# Patient Record
Sex: Female | Born: 1950 | Race: White | Hispanic: No | Marital: Married | Smoking: Never smoker
Health system: Southern US, Community
[De-identification: ages and names within clinical notes are randomized; demographics above are authoritative.]

## PROBLEM LIST (undated history)

## (undated) DIAGNOSIS — K219 Gastro-esophageal reflux disease without esophagitis: Secondary | ICD-10-CM

## (undated) DIAGNOSIS — K5790 Diverticulosis of intestine, part unspecified, without perforation or abscess without bleeding: Secondary | ICD-10-CM

## (undated) DIAGNOSIS — K760 Fatty (change of) liver, not elsewhere classified: Secondary | ICD-10-CM

## (undated) DIAGNOSIS — T8859XA Other complications of anesthesia, initial encounter: Secondary | ICD-10-CM

## (undated) DIAGNOSIS — K2100 Gastro-esophageal reflux disease with esophagitis, without bleeding: Secondary | ICD-10-CM

## (undated) DIAGNOSIS — D649 Anemia, unspecified: Secondary | ICD-10-CM

## (undated) DIAGNOSIS — M4802 Spinal stenosis, cervical region: Secondary | ICD-10-CM

## (undated) DIAGNOSIS — J189 Pneumonia, unspecified organism: Secondary | ICD-10-CM

## (undated) DIAGNOSIS — M51369 Other intervertebral disc degeneration, lumbar region without mention of lumbar back pain or lower extremity pain: Secondary | ICD-10-CM

## (undated) DIAGNOSIS — E785 Hyperlipidemia, unspecified: Secondary | ICD-10-CM

## (undated) DIAGNOSIS — J45909 Unspecified asthma, uncomplicated: Secondary | ICD-10-CM

## (undated) DIAGNOSIS — M5481 Occipital neuralgia: Secondary | ICD-10-CM

## (undated) DIAGNOSIS — F419 Anxiety disorder, unspecified: Secondary | ICD-10-CM

## (undated) DIAGNOSIS — M5417 Radiculopathy, lumbosacral region: Secondary | ICD-10-CM

## (undated) DIAGNOSIS — M47812 Spondylosis without myelopathy or radiculopathy, cervical region: Secondary | ICD-10-CM

## (undated) HISTORY — PX: COLONOSCOPY: SHX174

---

## 1982-04-05 HISTORY — PX: TUBAL LIGATION: SHX77

## 2004-04-05 HISTORY — PX: LAPAROSCOPIC CHOLECYSTECTOMY: SUR755

## 2005-10-31 HISTORY — PX: ANTERIOR CERVICAL DECOMP/DISCECTOMY FUSION: SHX1161

## 2006-04-05 HISTORY — PX: POSTERIOR LAMINECTOMY / DECOMPRESSION CERVICAL SPINE: SUR739

## 2016-04-05 HISTORY — PX: DUPUYTREN CONTRACTURE RELEASE: SHX1478

## 2019-06-04 HISTORY — PX: POSTERIOR FUSION CERVICAL SPINE: SUR628

## 2020-01-17 HISTORY — PX: PERCUTANEOUS LIVER BIOPSY: SUR136

## 2020-12-04 DIAGNOSIS — N813 Complete uterovaginal prolapse: Secondary | ICD-10-CM

## 2020-12-04 HISTORY — DX: Complete uterovaginal prolapse: N81.3

## 2020-12-22 HISTORY — PX: VAGINAL HYSTERECTOMY: SHX2639

## 2021-08-18 ENCOUNTER — Other Ambulatory Visit: Payer: Self-pay | Admitting: Sports Medicine

## 2021-08-18 DIAGNOSIS — G8929 Other chronic pain: Secondary | ICD-10-CM

## 2021-08-18 DIAGNOSIS — M84359A Stress fracture, hip, unspecified, initial encounter for fracture: Secondary | ICD-10-CM

## 2021-08-19 ENCOUNTER — Ambulatory Visit: Payer: Medicare HMO

## 2021-08-20 ENCOUNTER — Ambulatory Visit
Admission: RE | Admit: 2021-08-20 | Discharge: 2021-08-20 | Disposition: A | Discharge status: Home / Self Care | Payer: Medicare HMO | Source: Ambulatory Visit | Attending: Sports Medicine | Admitting: Sports Medicine

## 2021-08-20 DIAGNOSIS — G8929 Other chronic pain: Secondary | ICD-10-CM | POA: Diagnosis present

## 2021-08-20 DIAGNOSIS — M25551 Pain in right hip: Secondary | ICD-10-CM | POA: Insufficient documentation

## 2021-08-20 DIAGNOSIS — M84359A Stress fracture, hip, unspecified, initial encounter for fracture: Secondary | ICD-10-CM | POA: Diagnosis present

## 2021-09-15 ENCOUNTER — Encounter (INDEPENDENT_AMBULATORY_CARE_PROVIDER_SITE_OTHER): Payer: Self-pay

## 2021-09-16 ENCOUNTER — Other Ambulatory Visit: Payer: Self-pay | Admitting: Orthopedic Surgery

## 2021-09-22 ENCOUNTER — Encounter
Admission: RE | Admit: 2021-09-22 | Discharge: 2021-09-22 | Disposition: A | Discharge status: Home / Self Care | Payer: Medicare HMO | Source: Ambulatory Visit | Attending: Orthopedic Surgery | Admitting: Orthopedic Surgery

## 2021-09-22 VITALS — BP 108/63 | HR 77 | Temp 97.8°F | Resp 14 | Ht 66.5 in | Wt 132.0 lb

## 2021-09-22 DIAGNOSIS — R8290 Unspecified abnormal findings in urine: Secondary | ICD-10-CM | POA: Diagnosis not present

## 2021-09-22 DIAGNOSIS — M87051 Idiopathic aseptic necrosis of right femur: Secondary | ICD-10-CM | POA: Diagnosis not present

## 2021-09-22 DIAGNOSIS — Z01818 Encounter for other preprocedural examination: Secondary | ICD-10-CM | POA: Insufficient documentation

## 2021-09-22 DIAGNOSIS — Z01812 Encounter for preprocedural laboratory examination: Secondary | ICD-10-CM

## 2021-09-22 HISTORY — DX: Gastro-esophageal reflux disease with esophagitis, without bleeding: K21.00

## 2021-09-22 HISTORY — DX: Unspecified asthma, uncomplicated: J45.909

## 2021-09-22 HISTORY — DX: Hypomagnesemia: E83.42

## 2021-09-22 HISTORY — DX: Hyperlipidemia, unspecified: E78.5

## 2021-09-22 HISTORY — DX: Other complications of anesthesia, initial encounter: T88.59XA

## 2021-09-22 HISTORY — DX: Anxiety disorder, unspecified: F41.9

## 2021-09-22 HISTORY — DX: Pneumonia, unspecified organism: J18.9

## 2021-09-22 HISTORY — DX: Diverticulosis of intestine, part unspecified, without perforation or abscess without bleeding: K57.90

## 2021-09-22 HISTORY — DX: Fatty (change of) liver, not elsewhere classified: K76.0

## 2021-09-22 HISTORY — DX: Anemia, unspecified: D64.9

## 2021-09-22 HISTORY — DX: Spondylosis without myelopathy or radiculopathy, cervical region: M47.812

## 2021-09-22 HISTORY — DX: Spinal stenosis, cervical region: M48.02

## 2021-09-22 HISTORY — DX: Occipital neuralgia: M54.81

## 2021-09-22 LAB — TYPE AND SCREEN
ABO/RH(D): A POS
Antibody Screen: NEGATIVE

## 2021-09-22 LAB — CBC WITH DIFFERENTIAL/PLATELET
Abs Immature Granulocytes: 0.03 10*3/uL (ref 0.00–0.07)
Basophils Absolute: 0 10*3/uL (ref 0.0–0.1)
Basophils Relative: 1 %
Eosinophils Absolute: 0.1 10*3/uL (ref 0.0–0.5)
Eosinophils Relative: 1 %
HCT: 40.6 % (ref 36.0–46.0)
Hemoglobin: 13 g/dL (ref 12.0–15.0)
Immature Granulocytes: 1 %
Lymphocytes Relative: 22 %
Lymphs Abs: 1.4 10*3/uL (ref 0.7–4.0)
MCH: 33.1 pg (ref 26.0–34.0)
MCHC: 32 g/dL (ref 30.0–36.0)
MCV: 103.3 fL — ABNORMAL HIGH (ref 80.0–100.0)
Monocytes Absolute: 1.4 10*3/uL — ABNORMAL HIGH (ref 0.1–1.0)
Monocytes Relative: 22 %
Neutro Abs: 3.6 10*3/uL (ref 1.7–7.7)
Neutrophils Relative %: 53 %
Platelets: 175 10*3/uL (ref 150–400)
RBC: 3.93 MIL/uL (ref 3.87–5.11)
RDW: 12.8 % (ref 11.5–15.5)
WBC: 6.5 10*3/uL (ref 4.0–10.5)
nRBC: 0 % (ref 0.0–0.2)

## 2021-09-22 LAB — COMPREHENSIVE METABOLIC PANEL
ALT: 65 U/L — ABNORMAL HIGH (ref 0–44)
AST: 45 U/L — ABNORMAL HIGH (ref 15–41)
Albumin: 3.9 g/dL (ref 3.5–5.0)
Alkaline Phosphatase: 64 U/L (ref 38–126)
Anion gap: 6 (ref 5–15)
BUN: 9 mg/dL (ref 8–23)
CO2: 28 mmol/L (ref 22–32)
Calcium: 10.3 mg/dL (ref 8.9–10.3)
Chloride: 107 mmol/L (ref 98–111)
Creatinine, Ser: 0.51 mg/dL (ref 0.44–1.00)
GFR, Estimated: >60 mL/min (ref >60)
Glucose, Bld: 107 mg/dL — ABNORMAL HIGH (ref 70–99)
Potassium: 3.4 mmol/L — ABNORMAL LOW (ref 3.5–5.1)
Sodium: 141 mmol/L (ref 135–145)
Total Bilirubin: 0.8 mg/dL (ref 0.3–1.2)
Total Protein: 7.1 g/dL (ref 6.5–8.1)

## 2021-09-22 LAB — SURGICAL PCR SCREEN
MRSA, PCR: NEGATIVE
Staphylococcus aureus: NEGATIVE

## 2021-09-22 LAB — URINALYSIS, ROUTINE W REFLEX MICROSCOPIC
Bilirubin Urine: NEGATIVE
Glucose, UA: NEGATIVE mg/dL
Ketones, ur: NEGATIVE mg/dL
Nitrite: POSITIVE — AB
Protein, ur: NEGATIVE mg/dL
Specific Gravity, Urine: 1.012 (ref 1.005–1.030)
WBC, UA: >50 WBC/hpf — ABNORMAL HIGH (ref 0–5)
pH: 5 (ref 5.0–8.0)

## 2021-09-22 NOTE — Patient Instructions (Addendum)
Your procedure is scheduled on: Tuesday, June 27 Report to the Registration Desk on the 1st floor of the [facility] To find out your arrival time, please call [phone] between 1PM - 3PM on: Monday, June 26 If your arrival time is 6:00 am, do not arrive prior to that time as the [facility] entrance doors do not open until 6:00 am.  REMEMBER: Instructions that are not followed completely may result in serious medical risk, up to and including death; or upon the discretion of your surgeon and anesthesiologist your surgery may need to be rescheduled.  Do not eat food after midnight the night before surgery.  No gum chewing, lozengers or hard candies.  You may however, drink CLEAR liquids up to 2 hours before you are scheduled to arrive for your surgery. Do not drink anything within 2 hours of your scheduled arrival time.  Clear liquids include: - water  - apple juice without pulp - gatorade (not RED colors) - black coffee or tea (Do NOT add milk or creamers to the coffee or tea) Do NOT drink anything that is not on this list.  In addition, your doctor has ordered for you to drink the provided  Ensure Pre-Surgery Clear Carbohydrate Drink  Drinking this carbohydrate drink up to two hours before surgery helps to reduce insulin resistance and improve patient outcomes. Please complete drinking 2 hours prior to scheduled arrival time.  TAKE THESE MEDICATIONS THE MORNING OF SURGERY WITH A SIP OF WATER:  Albuterol nebulizer Breo ellipta inhaler Pantoprazole (protonix) - (take one the night before and one on the morning of surgery - helps to prevent nausea after surgery.) Tramadol as needed for pain  Use inhalers on the day of surgery and bring your albuterol inhaler to the hospital.  One week prior to surgery: starting today, June 20 Stop Anti-inflammatories (NSAIDS) such as Advil, Aleve, Ibuprofen, Motrin, Naproxen, Naprosyn and Aspirin based products such as Excedrin, Goodys  Powder, BC Powder. Stop ANY OVER THE COUNTER supplements until after surgery. You may however, continue to take Tylenol if needed for pain up until the day of surgery.  No Alcohol for 24 hours before or after surgery.  On the morning of surgery brush your teeth with toothpaste and water, you may rinse your mouth with mouthwash if you wish. Do not swallow any toothpaste or mouthwash.  Use CHG Soap as directed on instruction sheet.  Do not wear jewelry, make-up, hairpins, clips or nail polish.  Do not wear lotions, powders, or perfumes.   Do not shave body from the neck down 48 hours prior to surgery just in case you cut yourself which could leave a site for infection.  Also, freshly shaved skin may become irritated if using the CHG soap.  Contact lenses, hearing aids and dentures may not be worn into surgery.  Do not bring valuables to the hospital. [facility] is not responsible for any missing/lost belongings or valuables.   Notify your doctor if there is any change in your medical condition (cold, fever, infection).  Wear comfortable clothing (specific to your surgery type) to the hospital.  After surgery, you can help prevent lung complications by doing breathing exercises.  Take deep breaths and cough every 1-2 hours. Your doctor may order a device called an Incentive Spirometer to help you take deep breaths.  If you are being admitted to the hospital overnight, leave your suitcase in the car. After surgery it may be brought to your room.  If you  are being discharged the day of surgery, you will not be allowed to drive home. You will need a responsible adult (18 years or older) to drive you home and stay with you that night.   If you are taking public transportation, you will need to have a responsible adult (18 years or older) with you. Please confirm with your physician that it is acceptable to use public transportation.   Please call the Pre-admissions Testing Dept. at  [phone] if you have any questions about these instructions.  Surgery Visitation Policy:  Patients undergoing a surgery or procedure may have two family members or support persons with them as long as the person is not COVID-19 positive or experiencing its symptoms.   Inpatient Visitation:    Visiting hours are 7 a.m. to 8 p.m. Up to four visitors are allowed at one time in a patient room, including children. The visitors may rotate out with other people during the day. One designated support person (adult) may remain overnight.

## 2021-09-24 LAB — URINE CULTURE: Culture: 70000 — AB

## 2021-09-24 NOTE — Progress Notes (Signed)
  [facility] Perioperative Services: Pre-Admission/Anesthesia Testing  Abnormal Lab Notification and Treatment Plan of Care   Date: 09/24/21  Name: Cindy Barber MRN:   [id number]  Re: Abnormal labs noted during PAT appointment   Notified:  Provider Name Provider Role Notification Mode  Kennedy Bucker, MD Orthopedics (Surgeon) Routed and/or faxed via [facility]   Abnormal Lab Value(s):   Lab Results  Component Value Date   COLORURINE YELLOW (A) 09/22/2021   APPEARANCEUR HAZY (A) 09/22/2021   LABSPEC 1.012 09/22/2021   PHURINE 5.0 09/22/2021   GLUCOSEU NEGATIVE 09/22/2021   HGBUR SMALL (A) 09/22/2021   BILIRUBINUR NEGATIVE 09/22/2021   KETONESUR NEGATIVE 09/22/2021   PROTEINUR NEGATIVE 09/22/2021   NITRITE POSITIVE (A) 09/22/2021   LEUKOCYTESUR LARGE (A) 09/22/2021   EPIU 11-20 09/22/2021   WBCU >50 (H) 09/22/2021   RBCU 0-5 09/22/2021   BACTERIA MANY (A) 09/22/2021   CULT 70,000 COLONIES/mL KLEBSIELLA PNEUMONIAE (A) 09/22/2021    Clinical Information and Notes:  Patient is scheduled for an elective RIGHT TOTAL HIP ARTHROPLASTY on 09/29/2021  UA performed in PAT consistent with/concerning for infection.  No leukocytosis noted on CBC; WBC 6500 Renal function: Estimated Creatinine Clearance: 62.8 mL/min (by C-G formula based on SCr of 0.51 mg/dL). Urine C&S added to assess for pathogenically significant growth.  Impression and Plan:  Cindy Barber with a UA that was (+) for infection. Reflex culture sent that grew out Klebsiella pneumoniae.  Patient was contacted to discuss whether or not she was experiencing symptoms to determine need for preoperative treatment.  Patient advising that surgeon Rosita Kea, MD) has already called her in a prescription for antibiotics to take prior to her surgery.  Of note, final urine culture, including susceptibilities): Received this morning (09/24/2021).  Check care everywhere to ensure that prescribed antibiotic is susceptible to  identify pathogen.  MD prescribed Macrobid 100 mg twice daily x 5 days.  Identified urinary pathogen is indeed sensitive to this medication.  No further action is required at this time.   Quentin Mulling, MSN, APRN, FNP-C, CEN [facility]  Peri-operative Services Nurse Practitioner Phone: [phone] Fax: [phone] 09/24/21 9:14 AM  NOTE: This note has been prepared using Dragon dictation software. Despite my best ability to proofread, there is always the potential that unintentional transcriptional errors may still occur from this process.

## 2021-09-29 ENCOUNTER — Observation Stay: Payer: Medicare HMO

## 2021-09-29 ENCOUNTER — Ambulatory Visit: Payer: Medicare HMO | Admitting: Anesthesiology

## 2021-09-29 ENCOUNTER — Other Ambulatory Visit: Payer: Self-pay

## 2021-09-29 ENCOUNTER — Encounter
Admission: RE | Disposition: A | Discharge status: Home Health | Payer: Self-pay | Source: Ambulatory Visit | Attending: Orthopedic Surgery

## 2021-09-29 ENCOUNTER — Ambulatory Visit: Payer: Medicare HMO

## 2021-09-29 ENCOUNTER — Observation Stay
Admission: RE | Admit: 2021-09-29 | Discharge: 2021-09-30 | Disposition: A | Discharge status: Home Health | Payer: Medicare HMO | Source: Ambulatory Visit | Attending: Orthopedic Surgery | Admitting: Orthopedic Surgery

## 2021-09-29 ENCOUNTER — Encounter: Payer: Self-pay | Admitting: Orthopedic Surgery

## 2021-09-29 ENCOUNTER — Ambulatory Visit: Payer: Medicare HMO | Admitting: Urgent Care

## 2021-09-29 DIAGNOSIS — Z79899 Other long term (current) drug therapy: Secondary | ICD-10-CM | POA: Insufficient documentation

## 2021-09-29 DIAGNOSIS — J45909 Unspecified asthma, uncomplicated: Secondary | ICD-10-CM | POA: Diagnosis not present

## 2021-09-29 DIAGNOSIS — M1611 Unilateral primary osteoarthritis, right hip: Principal | ICD-10-CM | POA: Insufficient documentation

## 2021-09-29 DIAGNOSIS — M87051 Idiopathic aseptic necrosis of right femur: Secondary | ICD-10-CM | POA: Insufficient documentation

## 2021-09-29 DIAGNOSIS — Z01812 Encounter for preprocedural laboratory examination: Secondary | ICD-10-CM

## 2021-09-29 DIAGNOSIS — Z96641 Presence of right artificial hip joint: Secondary | ICD-10-CM

## 2021-09-29 HISTORY — PX: TOTAL HIP ARTHROPLASTY: SHX124

## 2021-09-29 LAB — CBC
HCT: 36.7 % (ref 36.0–46.0)
Hemoglobin: 11.7 g/dL — ABNORMAL LOW (ref 12.0–15.0)
MCH: 33 pg (ref 26.0–34.0)
MCHC: 31.9 g/dL (ref 30.0–36.0)
MCV: 103.4 fL — ABNORMAL HIGH (ref 80.0–100.0)
Platelets: 191 10*3/uL (ref 150–400)
RBC: 3.55 MIL/uL — ABNORMAL LOW (ref 3.87–5.11)
RDW: 12.2 % (ref 11.5–15.5)
WBC: 15.5 10*3/uL — ABNORMAL HIGH (ref 4.0–10.5)
nRBC: 0 % (ref 0.0–0.2)

## 2021-09-29 LAB — CREATININE, SERUM
Creatinine, Ser: 0.53 mg/dL (ref 0.44–1.00)
GFR, Estimated: >60 mL/min (ref >60)

## 2021-09-29 LAB — ABO/RH: ABO/RH(D): A POS

## 2021-09-29 SURGERY — ARTHROPLASTY, HIP, TOTAL, ANTERIOR APPROACH
Anesthesia: Spinal | Site: Hip | Laterality: Right

## 2021-09-29 MED ORDER — METHOCARBAMOL 500 MG PO TABS
500.0000 mg | ORAL_TABLET | Freq: Four times a day (QID) | ORAL | Status: DC | PRN
  Administered 2021-09-30: 500 mg via ORAL
  Filled 2021-09-29: qty 1

## 2021-09-29 MED ORDER — LACTATED RINGERS IV SOLN: INTRAVENOUS | Status: DC

## 2021-09-29 MED ORDER — PROPOFOL 1000 MG/100ML IV EMUL
INTRAVENOUS | Status: AC
  Filled 2021-09-29: qty 100

## 2021-09-29 MED ORDER — FENTANYL CITRATE (PF) 100 MCG/2ML IJ SOLN
INTRAMUSCULAR | Status: DC | PRN
  Administered 2021-09-29: 25 ug via INTRAVENOUS
  Administered 2021-09-29: 50 ug via INTRAVENOUS
  Administered 2021-09-29: 25 ug via INTRAVENOUS

## 2021-09-29 MED ORDER — TRAMADOL HCL 50 MG PO TABS
50.0000 mg | ORAL_TABLET | Freq: Four times a day (QID) | ORAL | Status: DC
  Administered 2021-09-29 – 2021-09-30 (×4): 50 mg via ORAL
  Filled 2021-09-29 (×4): qty 1

## 2021-09-29 MED ORDER — ONDANSETRON HCL 4 MG PO TABS: 4.0000 mg | ORAL_TABLET | Freq: Four times a day (QID) | ORAL | Status: DC | PRN

## 2021-09-29 MED ORDER — ADULT MULTIVITAMIN W/MINERALS CH
1.0000 | ORAL_TABLET | Freq: Every day | ORAL | Status: DC
  Administered 2021-09-30: 1 via ORAL
  Filled 2021-09-29: qty 1

## 2021-09-29 MED ORDER — OXYCODONE HCL 5 MG PO TABS
5.0000 mg | ORAL_TABLET | ORAL | Status: DC | PRN
  Administered 2021-09-29: 5 mg via ORAL
  Filled 2021-09-29: qty 2
  Filled 2021-09-29: qty 1

## 2021-09-29 MED ORDER — SODIUM CHLORIDE (PF) 0.9 % IJ SOLN
INTRAMUSCULAR | Status: DC | PRN
  Administered 2021-09-29: 90 mL

## 2021-09-29 MED ORDER — MIDAZOLAM HCL 5 MG/5ML IJ SOLN
INTRAMUSCULAR | Status: DC | PRN
  Administered 2021-09-29: 2 mg via INTRAVENOUS

## 2021-09-29 MED ORDER — ENOXAPARIN SODIUM 40 MG/0.4ML IJ SOSY
40.0000 mg | PREFILLED_SYRINGE | INTRAMUSCULAR | Status: DC
  Administered 2021-09-30: 40 mg via SUBCUTANEOUS
  Filled 2021-09-29: qty 0.4

## 2021-09-29 MED ORDER — FOLIC ACID 1 MG PO TABS
500.0000 ug | ORAL_TABLET | Freq: Every day | ORAL | Status: DC
Start: 2021-09-30 — End: 2021-09-30
  Administered 2021-09-30: 0.5 mg via ORAL
  Filled 2021-09-29: qty 1

## 2021-09-29 MED ORDER — CEFAZOLIN SODIUM-DEXTROSE 2-4 GM/100ML-% IV SOLN
INTRAVENOUS | Status: AC
  Filled 2021-09-29: qty 100

## 2021-09-29 MED ORDER — OXYCODONE HCL 5 MG PO TABS
ORAL_TABLET | ORAL | Status: AC
  Filled 2021-09-29: qty 1

## 2021-09-29 MED ORDER — CEFAZOLIN SODIUM-DEXTROSE 2-4 GM/100ML-% IV SOLN
2.0000 g | INTRAVENOUS | Status: AC
  Administered 2021-09-29: 2 g via INTRAVENOUS

## 2021-09-29 MED ORDER — SUCCINYLCHOLINE CHLORIDE 200 MG/10ML IV SOSY
PREFILLED_SYRINGE | INTRAVENOUS | Status: DC | PRN
  Administered 2021-09-29: 80 mg via INTRAVENOUS

## 2021-09-29 MED ORDER — CHLORHEXIDINE GLUCONATE 0.12 % MT SOLN: 15.0000 mL | Freq: Once | OROMUCOSAL | Status: AC

## 2021-09-29 MED ORDER — ALBUTEROL SULFATE (2.5 MG/3ML) 0.083% IN NEBU
2.5000 mg | INHALATION_SOLUTION | Freq: Four times a day (QID) | RESPIRATORY_TRACT | Status: DC | PRN
Start: 2021-09-29 — End: 2021-09-30

## 2021-09-29 MED ORDER — ALBUTEROL SULFATE HFA 108 (90 BASE) MCG/ACT IN AERS: 2.0000 | INHALATION_SPRAY | Freq: Four times a day (QID) | RESPIRATORY_TRACT | Status: DC | PRN

## 2021-09-29 MED ORDER — DOCUSATE SODIUM 100 MG PO CAPS
100.0000 mg | ORAL_CAPSULE | Freq: Two times a day (BID) | ORAL | Status: DC
  Administered 2021-09-29 – 2021-09-30 (×2): 100 mg via ORAL
  Filled 2021-09-29 (×2): qty 1

## 2021-09-29 MED ORDER — DIPHENHYDRAMINE HCL 12.5 MG/5ML PO ELIX: 12.5000 mg | ORAL_SOLUTION | ORAL | Status: DC | PRN

## 2021-09-29 MED ORDER — METOCLOPRAMIDE HCL 5 MG PO TABS: 5.0000 mg | ORAL_TABLET | Freq: Three times a day (TID) | ORAL | Status: DC | PRN

## 2021-09-29 MED ORDER — LIDOCAINE HCL (PF) 1 % IJ SOLN
INTRAMUSCULAR | Status: DC | PRN
  Administered 2021-09-29: 30 mL

## 2021-09-29 MED ORDER — BISACODYL 5 MG PO TBEC: 5.0000 mg | DELAYED_RELEASE_TABLET | Freq: Every day | ORAL | Status: DC | PRN

## 2021-09-29 MED ORDER — FENTANYL CITRATE (PF) 100 MCG/2ML IJ SOLN
INTRAMUSCULAR | Status: AC
  Administered 2021-09-29: 25 ug via INTRAVENOUS
  Filled 2021-09-29: qty 2

## 2021-09-29 MED ORDER — FENTANYL CITRATE (PF) 100 MCG/2ML IJ SOLN
INTRAMUSCULAR | Status: AC
  Filled 2021-09-29: qty 2

## 2021-09-29 MED ORDER — LIDOCAINE HCL (PF) 1 % IJ SOLN
INTRAMUSCULAR | Status: AC
  Filled 2021-09-29: qty 30

## 2021-09-29 MED ORDER — MAGNESIUM CHLORIDE 64 MG PO TBEC
2.0000 | DELAYED_RELEASE_TABLET | Freq: Two times a day (BID) | ORAL | Status: DC
  Administered 2021-09-29 – 2021-09-30 (×2): 128 mg via ORAL
  Filled 2021-09-29 (×2): qty 2

## 2021-09-29 MED ORDER — METOCLOPRAMIDE HCL 5 MG/ML IJ SOLN: 5.0000 mg | Freq: Three times a day (TID) | INTRAMUSCULAR | Status: DC | PRN

## 2021-09-29 MED ORDER — ZOLPIDEM TARTRATE 5 MG PO TABS: 5.0000 mg | ORAL_TABLET | Freq: Every evening | ORAL | Status: DC | PRN

## 2021-09-29 MED ORDER — BUPIVACAINE HCL (PF) 0.5 % IJ SOLN
INTRAMUSCULAR | Status: DC | PRN
  Administered 2021-09-29: 3 mL

## 2021-09-29 MED ORDER — PROPOFOL 500 MG/50ML IV EMUL
INTRAVENOUS | Status: DC | PRN
  Administered 2021-09-29: 40 ug/kg/min via INTRAVENOUS

## 2021-09-29 MED ORDER — ACETAMINOPHEN 500 MG PO TABS
1000.0000 mg | ORAL_TABLET | Freq: Once | ORAL | Status: AC
Start: 2021-09-29 — End: 2021-09-29
  Administered 2021-09-29: 1000 mg via ORAL

## 2021-09-29 MED ORDER — OXYCODONE HCL 5 MG PO TABS
5.0000 mg | ORAL_TABLET | Freq: Once | ORAL | Status: AC
  Administered 2021-09-29: 5 mg via ORAL

## 2021-09-29 MED ORDER — ONDANSETRON HCL 4 MG/2ML IJ SOLN
INTRAMUSCULAR | Status: DC | PRN
  Administered 2021-09-29: 4 mg via INTRAVENOUS

## 2021-09-29 MED ORDER — SODIUM CHLORIDE 0.9 % IV SOLN
INTRAVENOUS | Status: DC
Start: 2021-09-29 — End: 2021-09-30

## 2021-09-29 MED ORDER — ACETAMINOPHEN 325 MG PO TABS: 325.0000 mg | ORAL_TABLET | Freq: Four times a day (QID) | ORAL | Status: DC | PRN

## 2021-09-29 MED ORDER — METHOCARBAMOL 1000 MG/10ML IJ SOLN
500.0000 mg | Freq: Four times a day (QID) | INTRAVENOUS | Status: DC | PRN
  Filled 2021-09-29: qty 5

## 2021-09-29 MED ORDER — PHENYLEPHRINE HCL-NACL 20-0.9 MG/250ML-% IV SOLN
INTRAVENOUS | Status: DC | PRN
  Administered 2021-09-29: 20 ug/min via INTRAVENOUS

## 2021-09-29 MED ORDER — LIDOCAINE HCL (PF) 2 % IJ SOLN
INTRAMUSCULAR | Status: AC
  Filled 2021-09-29: qty 5

## 2021-09-29 MED ORDER — FLEET ENEMA 7-19 GM/118ML RE ENEM: 1.0000 | ENEMA | Freq: Once | RECTAL | Status: DC | PRN

## 2021-09-29 MED ORDER — SENNOSIDES-DOCUSATE SODIUM 8.6-50 MG PO TABS
1.0000 | ORAL_TABLET | Freq: Every evening | ORAL | Status: DC | PRN
Start: 2021-09-29 — End: 2021-09-30
  Administered 2021-09-30: 1 via ORAL
  Filled 2021-09-29: qty 1

## 2021-09-29 MED ORDER — EPHEDRINE SULFATE (PRESSORS) 50 MG/ML IJ SOLN
INTRAMUSCULAR | Status: DC | PRN
  Administered 2021-09-29: 5 mg via INTRAVENOUS

## 2021-09-29 MED ORDER — PHENOL 1.4 % MT LIQD
1.0000 | OROMUCOSAL | Status: DC | PRN
Start: 2021-09-29 — End: 2021-09-30

## 2021-09-29 MED ORDER — BUPIVACAINE-EPINEPHRINE (PF) 0.25% -1:200000 IJ SOLN
INTRAMUSCULAR | Status: AC
  Filled 2021-09-29: qty 30

## 2021-09-29 MED ORDER — MENTHOL 3 MG MT LOZG: 1.0000 | LOZENGE | OROMUCOSAL | Status: DC | PRN

## 2021-09-29 MED ORDER — FLUTICASONE FUROATE-VILANTEROL 200-25 MCG/ACT IN AEPB
1.0000 | INHALATION_SPRAY | Freq: Every day | RESPIRATORY_TRACT | Status: DC
Start: 2021-09-30 — End: 2021-09-30
  Administered 2021-09-30: 1 via RESPIRATORY_TRACT
  Filled 2021-09-29: qty 28

## 2021-09-29 MED ORDER — PANTOPRAZOLE SODIUM 40 MG PO TBEC: 40.0000 mg | DELAYED_RELEASE_TABLET | Freq: Every day | ORAL | Status: DC

## 2021-09-29 MED ORDER — ONDANSETRON HCL 4 MG/2ML IJ SOLN: 4.0000 mg | Freq: Four times a day (QID) | INTRAMUSCULAR | Status: DC | PRN

## 2021-09-29 MED ORDER — ACETAMINOPHEN 500 MG PO TABS
1000.0000 mg | ORAL_TABLET | Freq: Four times a day (QID) | ORAL | Status: DC
  Administered 2021-09-29 – 2021-09-30 (×3): 1000 mg via ORAL
  Filled 2021-09-29 (×3): qty 2

## 2021-09-29 MED ORDER — PROPOFOL 1000 MG/100ML IV EMUL
INTRAVENOUS | Status: AC
Start: 2021-09-29 — End: ?
  Filled 2021-09-29: qty 100

## 2021-09-29 MED ORDER — CHLORHEXIDINE GLUCONATE 0.12 % MT SOLN
OROMUCOSAL | Status: AC
  Administered 2021-09-29: 15 mL via OROMUCOSAL
  Filled 2021-09-29: qty 15

## 2021-09-29 MED ORDER — MIDAZOLAM HCL 2 MG/2ML IJ SOLN
INTRAMUSCULAR | Status: AC
  Filled 2021-09-29: qty 2

## 2021-09-29 MED ORDER — PANTOPRAZOLE SODIUM 40 MG PO TBEC
40.0000 mg | DELAYED_RELEASE_TABLET | Freq: Every day | ORAL | Status: DC
  Administered 2021-09-30: 40 mg via ORAL
  Filled 2021-09-29: qty 1

## 2021-09-29 MED ORDER — ACETAMINOPHEN 500 MG PO TABS
ORAL_TABLET | ORAL | Status: AC
  Filled 2021-09-29: qty 2

## 2021-09-29 MED ORDER — ORAL CARE MOUTH RINSE: 15.0000 mL | Freq: Once | OROMUCOSAL | Status: AC

## 2021-09-29 MED ORDER — PHENYLEPHRINE HCL-NACL 20-0.9 MG/250ML-% IV SOLN
INTRAVENOUS | Status: AC
  Filled 2021-09-29: qty 250

## 2021-09-29 MED ORDER — 0.9 % SODIUM CHLORIDE (POUR BTL) OPTIME
TOPICAL | Status: DC | PRN
  Administered 2021-09-29: 1000 mL

## 2021-09-29 MED ORDER — CEFAZOLIN SODIUM-DEXTROSE 2-4 GM/100ML-% IV SOLN
2.0000 g | Freq: Four times a day (QID) | INTRAVENOUS | Status: AC
  Administered 2021-09-29 – 2021-09-30 (×2): 2 g via INTRAVENOUS
  Filled 2021-09-29 (×2): qty 100

## 2021-09-29 MED ORDER — ALUM & MAG HYDROXIDE-SIMETH 200-200-20 MG/5ML PO SUSP
30.0000 mL | ORAL | Status: DC | PRN
Start: 2021-09-29 — End: 2021-09-30

## 2021-09-29 MED ORDER — PROPOFOL 10 MG/ML IV BOLUS
INTRAVENOUS | Status: DC | PRN
  Administered 2021-09-29 (×2): 30 mg via INTRAVENOUS
  Administered 2021-09-29: 70 mg via INTRAVENOUS
  Administered 2021-09-29: 30 mg via INTRAVENOUS

## 2021-09-29 MED ORDER — ATORVASTATIN CALCIUM 10 MG PO TABS
10.0000 mg | ORAL_TABLET | Freq: Every day | ORAL | Status: DC
  Administered 2021-09-29: 10 mg via ORAL
  Filled 2021-09-29: qty 1

## 2021-09-29 MED ORDER — OXYCODONE HCL 5 MG PO TABS: 10.0000 mg | ORAL_TABLET | ORAL | Status: DC | PRN

## 2021-09-29 MED ORDER — HYDROMORPHONE HCL 1 MG/ML IJ SOLN: 0.5000 mg | INTRAMUSCULAR | Status: DC | PRN

## 2021-09-29 MED ORDER — SURGIRINSE WOUND IRRIGATION SYSTEM - OPTIME
TOPICAL | Status: DC | PRN
  Administered 2021-09-29: 450 mL via TOPICAL

## 2021-09-29 MED ORDER — FENTANYL CITRATE (PF) 100 MCG/2ML IJ SOLN
25.0000 ug | INTRAMUSCULAR | Status: DC | PRN
  Administered 2021-09-29 (×3): 25 ug via INTRAVENOUS

## 2021-09-29 SURGICAL SUPPLY — 54 items
BLADE SAGITTAL AGGR TOOTH XLG (BLADE) ×2 IMPLANT
BNDG COHESIVE 6X5 TAN ST LF (GAUZE/BANDAGES/DRESSINGS) ×6 IMPLANT
CANISTER WOUND CARE 500ML ATS (WOUND CARE) ×2 IMPLANT
CHLORAPREP W/TINT 26 (MISCELLANEOUS) ×2 IMPLANT
COVER BACK TABLE REUSABLE LG (DRAPES) ×2 IMPLANT
DRAPE 3/4 80X56 (DRAPES) ×6 IMPLANT
DRAPE C-ARM XRAY 36X54 (DRAPES) ×2 IMPLANT
DRAPE INCISE IOBAN 66X60 STRL (DRAPES) IMPLANT
DRAPE POUCH INSTRU U-SHP 10X18 (DRAPES) ×2 IMPLANT
DRESSING SURGICEL FIBRLLR 1X2 (HEMOSTASIS) ×2 IMPLANT
DRSG MEPILEX SACRM 8.7X9.8 (GAUZE/BANDAGES/DRESSINGS) ×2 IMPLANT
DRSG SURGICEL FIBRILLAR 1X2 (HEMOSTASIS) ×4
ELECT BLADE 6.5 EXT (BLADE) ×2 IMPLANT
ELECT REM PT RETURN 9FT ADLT (ELECTROSURGICAL) ×2
ELECTRODE REM PT RTRN 9FT ADLT (ELECTROSURGICAL) ×1 IMPLANT
GLOVE BIOGEL PI IND STRL 9 (GLOVE) ×1 IMPLANT
GLOVE BIOGEL PI INDICATOR 9 (GLOVE) ×1
GLOVE SURG SYN 9.0  PF PI (GLOVE) ×2
GLOVE SURG SYN 9.0 PF PI (GLOVE) ×2 IMPLANT
GOWN SRG 2XL LVL 4 RGLN SLV (GOWNS) ×1 IMPLANT
GOWN STRL NON-REIN 2XL LVL4 (GOWNS) ×1
GOWN STRL REUS W/ TWL LRG LVL3 (GOWN DISPOSABLE) ×1 IMPLANT
GOWN STRL REUS W/TWL LRG LVL3 (GOWN DISPOSABLE) ×1
HIP FEM HD S 28 (Head) ×1 IMPLANT
HOLDER FOLEY CATH W/STRAP (MISCELLANEOUS) ×2 IMPLANT
HOOD PEEL AWAY FLYTE STAYCOOL (MISCELLANEOUS) ×2 IMPLANT
KIT PREVENA INCISION MGT 13 (CANNISTER) ×2 IMPLANT
LINER DBL MOB SZ 0 52MM (Liner) ×1 IMPLANT
MANIFOLD NEPTUNE II (INSTRUMENTS) ×2 IMPLANT
MAT ABSORB  FLUID 56X50 GRAY (MISCELLANEOUS) ×1
MAT ABSORB FLUID 56X50 GRAY (MISCELLANEOUS) ×1 IMPLANT
NDL SPNL 20GX3.5 QUINCKE YW (NEEDLE) ×2 IMPLANT
NEEDLE SPNL 20GX3.5 QUINCKE YW (NEEDLE) ×4 IMPLANT
NS IRRIG 1000ML POUR BTL (IV SOLUTION) ×2 IMPLANT
PACK HIP COMPR (MISCELLANEOUS) ×2 IMPLANT
SCALPEL PROTECTED #10 DISP (BLADE) ×4 IMPLANT
SHELL ACETABULAR SZ 52 DM (Shell) ×1 IMPLANT
SOLUTION IRRIG SURGIPHOR (IV SOLUTION) ×2 IMPLANT
STAPLER SKIN PROX 35W (STAPLE) ×2 IMPLANT
STEM FEMORAL SZ3  STD COLLARED (Stem) ×1 IMPLANT
STRAP SAFETY 5IN WIDE (MISCELLANEOUS) ×2 IMPLANT
SUT DVC 2 QUILL PDO  T11 36X36 (SUTURE) ×1
SUT DVC 2 QUILL PDO T11 36X36 (SUTURE) ×1 IMPLANT
SUT SILK 0 (SUTURE) ×1
SUT SILK 0 30XBRD TIE 6 (SUTURE) ×1 IMPLANT
SUT V-LOC 90 ABS DVC 3-0 CL (SUTURE) ×2 IMPLANT
SUT VIC AB 1 CT1 36 (SUTURE) ×2 IMPLANT
SYR 50ML LL SCALE MARK (SYRINGE) ×4 IMPLANT
SYR BULB IRRIG 60ML STRL (SYRINGE) ×2 IMPLANT
TAPE MICROFOAM 4IN (TAPE) IMPLANT
TAPE STRIPS DRAPE STRL (GAUZE/BANDAGES/DRESSINGS) ×2 IMPLANT
TOWEL OR 17X26 4PK STRL BLUE (TOWEL DISPOSABLE) IMPLANT
TRAY FOLEY MTR SLVR 16FR STAT (SET/KITS/TRAYS/PACK) ×2 IMPLANT
WATER STERILE IRR 1000ML POUR (IV SOLUTION) ×2 IMPLANT

## 2021-09-29 NOTE — Progress Notes (Signed)
PT Cancellation Note  Patient Details Name: Cindy Barber MRN: [id number] DOB: 1951/02/14   Cancelled Treatment:    Reason Eval/Treat Not Completed: Other (comment) PT contacted nursing at 1600 and pt was not medically ready to be mobilized. Nursing stated pt did not have any movement of LEs at this time. Hold PT today, will re-attempt next date.   Jaziel Bennett, SPT  [name] 09/29/2021, 4:30 PM

## 2021-09-29 NOTE — Anesthesia Procedure Notes (Addendum)
Spinal  Patient location during procedure: OR Start time: 09/29/2021 12:59 PM End time: 09/29/2021 1:03 PM Reason for block: surgical anesthesia Staffing Performed: anesthesiologist and other anesthesia staff  Anesthesiologist: Piscitello, Cleda Mccreedy, MD Other anesthesia staff: Alanson Puls, RN Performed by: Alanson Puls, RN Authorized by: Rosaria Ferries, MD   Preanesthetic Checklist Completed: patient identified, IV checked, site marked, risks and benefits discussed, surgical consent, monitors and equipment checked, pre-op evaluation and timeout performed Spinal Block Patient position: sitting Prep: DuraPrep Patient monitoring: heart rate, cardiac monitor, continuous pulse ox and blood pressure Approach: midline Location: L4-5 Injection technique: single-shot Needle Needle type: Sprotte  Needle gauge: 24 G Needle length: 9 cm Assessment Sensory level: T4 Events: CSF return Additional Notes IV functioning, monitors applied to pt. Expiration date of kit checked and confirmed to be in date. Sterile prep and drape, hand hygiene and sterile gloved used. Pt was positioned and spine was prepped in sterile fashion. Skin was anesthetized with lidocaine. Free flow of clear CSF obtained prior to injecting local anesthetic into CSF x 1 attempt. Spinal needle aspirated freely following injection. Needle was carefully withdrawn, and pt tolerated procedure well. Loss of motor and sensory on exam post injection.

## 2021-09-30 ENCOUNTER — Encounter: Payer: Self-pay | Admitting: Orthopedic Surgery

## 2021-09-30 DIAGNOSIS — M1611 Unilateral primary osteoarthritis, right hip: Secondary | ICD-10-CM | POA: Diagnosis not present

## 2021-09-30 LAB — BASIC METABOLIC PANEL
Anion gap: 6 (ref 5–15)
BUN: 9 mg/dL (ref 8–23)
CO2: 26 mmol/L (ref 22–32)
Calcium: 9.1 mg/dL (ref 8.9–10.3)
Chloride: 105 mmol/L (ref 98–111)
Creatinine, Ser: 0.56 mg/dL (ref 0.44–1.00)
GFR, Estimated: >60 mL/min (ref >60)
Glucose, Bld: 185 mg/dL — ABNORMAL HIGH (ref 70–99)
Potassium: 4.1 mmol/L (ref 3.5–5.1)
Sodium: 137 mmol/L (ref 135–145)

## 2021-09-30 LAB — CBC
HCT: 31.5 % — ABNORMAL LOW (ref 36.0–46.0)
Hemoglobin: 10.3 g/dL — ABNORMAL LOW (ref 12.0–15.0)
MCH: 33.6 pg (ref 26.0–34.0)
MCHC: 32.7 g/dL (ref 30.0–36.0)
MCV: 102.6 fL — ABNORMAL HIGH (ref 80.0–100.0)
Platelets: 184 10*3/uL (ref 150–400)
RBC: 3.07 MIL/uL — ABNORMAL LOW (ref 3.87–5.11)
RDW: 12.2 % (ref 11.5–15.5)
WBC: 14.1 10*3/uL — ABNORMAL HIGH (ref 4.0–10.5)
nRBC: 0 % (ref 0.0–0.2)

## 2021-09-30 MED ORDER — DOCUSATE SODIUM 100 MG PO CAPS: 100.0000 mg | ORAL_CAPSULE | Freq: Two times a day (BID) | ORAL | 0 refills | Status: DC

## 2021-09-30 MED ORDER — METHOCARBAMOL 500 MG PO TABS: 500.0000 mg | ORAL_TABLET | Freq: Four times a day (QID) | ORAL | 0 refills | Status: DC | PRN

## 2021-09-30 MED ORDER — TRAMADOL HCL 50 MG PO TABS: 50.0000 mg | ORAL_TABLET | Freq: Four times a day (QID) | ORAL | 0 refills | Status: DC | PRN

## 2021-09-30 MED ORDER — ENOXAPARIN SODIUM 40 MG/0.4ML IJ SOSY: 40.0000 mg | PREFILLED_SYRINGE | INTRAMUSCULAR | 0 refills | Status: DC

## 2021-09-30 MED ORDER — OXYCODONE HCL 5 MG PO TABS
5.0000 mg | ORAL_TABLET | ORAL | 0 refills | Status: DC | PRN
Start: 2021-09-30 — End: 2023-06-23

## 2021-09-30 NOTE — Plan of Care (Signed)

## 2021-09-30 NOTE — Progress Notes (Signed)
Discharge instructions reviewed with patient and spouse. Also, lovenox teaching done with pt. She verbalized understanding of instructions

## 2021-09-30 NOTE — Progress Notes (Signed)
Spoke with the patient and discussed DC plan and needs She lives at home with her husband and her daughter until her newly built house is ready in approx 3 weeks She has a rolling walker and a shower seat as well as a cane I offered her a 3 in 1 and she declined She has transportation with her husband and her daughter She can afford her medication

## 2021-09-30 NOTE — Progress Notes (Signed)
   Subjective: 1 Day Post-Op Procedure(s) (LRB): TOTAL HIP ARTHROPLASTY ANTERIOR APPROACH (Right) Patient reports pain as moderate.   Patient is well, and has had no acute complaints or problems Denies any CP, SOB, ABD pain. We will continue therapy today.  Plan is to go Home after hospital stay.  Objective: Vital signs in last 24 hours: Temp:  [97.5 F (36.4 C)-97.9 F (36.6 C)] 97.7 F (36.5 C) (06/28 0743) Pulse Rate:  [69-99] 89 (06/28 0743) Resp:  [12-23] 16 (06/28 0743) BP: (100-132)/(62-82) 109/68 (06/28 0743) SpO2:  [91 %-100 %] 91 % (06/28 0743) Weight:  [61.2 kg] 61.2 kg (06/27 1046)  Intake/Output from previous day: 06/27 0701 - 06/28 0700 In: 600 [P.O.:200; I.V.:400] Out: 3250 [Urine:3050; Blood:200] Intake/Output this shift: No intake/output data recorded.  Recent Labs    09/29/21 1723 09/30/21 0404  HGB 11.7* 10.3*   Recent Labs    09/29/21 1723 09/30/21 0404  WBC 15.5* 14.1*  RBC 3.55* 3.07*  HCT 36.7 31.5*  PLT 191 184   Recent Labs    09/29/21 1723 09/30/21 0404  NA  --  137  K  --  4.1  CL  --  105  CO2  --  26  BUN  --  9  CREATININE 0.53 0.56  GLUCOSE  --  185*  CALCIUM  --  9.1   No results for input(s): "LABPT", "INR" in the last 72 hours.  EXAM General - Patient is Alert, Appropriate, and Oriented Extremity - Neurovascular intact Sensation intact distally Intact pulses distally Dorsiflexion/Plantar flexion intact No cellulitis present Compartment soft Dressing - dressing C/D/I and no drainage, provena intact with out drainage Motor Function - intact, moving foot and toes well on exam.   Past Medical History:  Diagnosis Date   Anemia    Anxiety    Asthma    Cervical spinal stenosis    Complication of anesthesia    slow to wake up   Cystocele and rectocele with complete uterovaginal prolapse 12/2020   Diverticulosis    GERD with esophagitis    Hepatic steatosis    Hyperlipidemia    Hypomagnesemia    Occipital  neuralgia of left side    Osteoarthritis cervical spine    Pneumonia     Assessment/Plan:   1 Day Post-Op Procedure(s) (LRB): TOTAL HIP ARTHROPLASTY ANTERIOR APPROACH (Right) Principal Problem:   Status post total hip replacement, right  Estimated body mass index is 21.79 kg/m as calculated from the following:   Height as of this encounter: 5\' 6"  (1.676 m).   Weight as of this encounter: 61.2 kg. Advance diet Up with therapy Pain well controlled VSS Labs stable CM to assist with discharge to home with HHPT pending completion of PT goals. Anticipate dc to home today  DVT Prophylaxis - Lovenox, TED hose, and SCDs Weight-Bearing as tolerated to right leg   T. , PA-C [facility] 09/30/2021, 7:51 AM

## 2021-09-30 NOTE — Plan of Care (Signed)

## 2021-09-30 NOTE — Discharge Instructions (Signed)

## 2021-09-30 NOTE — Progress Notes (Signed)
Physical Therapy Evaluation Patient Details Name: Cindy Barber MRN: [id number] DOB: 02-21-1951 Today's Date: 09/30/2021  History of Present Illness  Pt is a 71 year old female s/p R anterior hip replacement 09/29/21. Significant PMH includes cervical spinal stenosis, cystocele and rectocele with complete uterovaginal prolapse, GERD, osteoarthritis cervical spine   Clinical Impression  Pt is a pleasant 71 year old female who was admitted for R anterior THA. Pt evaluated POD 1. Pt reported pain in R hip at 4/10 while seated in chair at start of session. Pt completed seated there-ex with HEP booklet education. Pt performs transfers and ambulation with RW and CGA. Pt ambulated 200 feet prior to stopping at the bathroom to void. Pt demonstrated good safety awareness and balance in standing while completing hygiene. Pt previously independent-mod I with ADLs and used a cane for ambulation until one month ago when pt began using RW. Would benefit from skilled PT to promote optimal return to PLOF. Recommend discharge to [facility] PT due to pt current mobility status and family support and assistance at discharge. This entire session was guided, instructed, and directly supervised by Elizabeth Palau, PT, DPT, GCS.     Recommendations for follow up therapy are one component of a multi-disciplinary discharge planning process, led by the attending physician.  Recommendations may be updated based on patient status, additional functional criteria and insurance authorization.  Follow Up Recommendations Home health PT      Assistance Recommended at Discharge Intermittent Supervision/Assistance  Patient can return home with the following  A little help with walking and/or transfers;A little help with bathing/dressing/bathroom;Assistance with cooking/housework;Assist for transportation;Help with stairs or ramp for entrance    Equipment Recommendations BSC/3in1  Recommendations for Other Services       Functional Status  Assessment Patient has had a recent decline in their functional status and demonstrates the ability to make significant improvements in function in a reasonable and predictable amount of time.     Precautions / Restrictions Precautions Precautions: Anterior Hip Restrictions Weight Bearing Restrictions: Yes RLE Weight Bearing: Weight bearing as tolerated      Mobility  Bed Mobility               General bed mobility comments: NT pt received and left in chair    Transfers Overall transfer level: Needs assistance Equipment used: Rolling walker (2 wheels) Transfers: Sit to/from Stand Sit to Stand: Min guard           General transfer comment: verbal cues for technique/hand placement    Ambulation/Gait Ambulation/Gait assistance: Min guard Gait Distance (Feet): 200 Feet Assistive device: Rolling walker (2 wheels) Gait Pattern/deviations: Step-through pattern, Decreased weight shift to right, Antalgic       General Gait Details: Ambulated with continuous gait pattern with RW and CGA with good WB tolerance through R LE; only slight antalgic gait with decreased weight shift to the R  Stairs            Wheelchair Mobility    Modified Rankin (Stroke Patients Only)       Balance Overall balance assessment: Needs assistance Sitting-balance support: Feet supported Sitting balance-Leahy Scale: Good Sitting balance - Comments: Pt able to sit in chair with back unsupported and feet placed on floor without UE support. No LOB.   Standing balance support: Bilateral upper extremity supported, During functional activity Standing balance-Leahy Scale: Good Standing balance comment: Pt utilized RW with bilat UE support during ambulation. However, pt able to perform toilet hygiene and handwashing with  Supervision for safety without UE support in standing. No LOB. Pt even able to use foot pedal to open bathroom trash can with R LE with 1 UE support on sink.                              Pertinent Vitals/Pain Pain Assessment Pain Assessment: 0-10 Pain Score: 4  (at rest seated in chair at start of session) Pain Location: R hip Pain Descriptors / Indicators: Discomfort Pain Intervention(s): Limited activity within patient's tolerance, Monitored during session, Premedicated before session, Repositioned    Home Living Family/patient expects to be discharged to:: Private residence Living Arrangements: Children Available Help at Discharge: Family;Available 24 hours/day Type of Home: House (basement level apartment at daugthers house, pt will be moving into newly built house in a approx 3 weeks) Home Access: Level entry       Home Layout: Multi-level;Able to live on main level with bedroom/bathroom (able to drive up to both levels of home with level entry if needed) Home Equipment: Shower seat;Rolling Walker (2 wheels);Cane - single point Additional Comments: Pt plans to complete 2 weeks of HH in daughter's home prior to transitioning to new home    Prior Function Prior Level of Function : Independent/Modified Independent             Mobility Comments: amb with RW the last month due to R hip pain, prior to that was amb with [facility] ADLs Comments: MOD I-I in all ADL; husband drives and assists with cooking and cleaning as needed     Hand Dominance   Dominant Hand: Right    Extremity/Trunk Assessment   Upper Extremity Assessment Upper Extremity Assessment: Overall WFL for tasks assessed    Lower Extremity Assessment Lower Extremity Assessment: RLE deficits/detail RLE Deficits / Details: POD 1 R anterior THA RLE: Unable to fully assess due to pain    Cervical / Trunk Assessment Cervical / Trunk Assessment: Normal  Communication   Communication: No difficulties  Cognition Arousal/Alertness: Awake/alert Behavior During Therapy: WFL for tasks assessed/performed Overall Cognitive Status: Within Functional Limits for tasks assessed                                           General Comments General comments (skin integrity, edema, etc.): vss throughout, wound vac intact    Exercises Total Joint Exercises Ankle Circles/Pumps: AROM, Both, 10 reps, Seated Gluteal Sets: AROM, Strengthening, Both, 10 reps, Seated Long Arc Quad: AROM, Strengthening, Both, 10 reps, Seated Knee Flexion: AROM, Strengthening, Right, 10 reps, Seated Other Exercises Other Exercises: Following ambulation in hallway, pt stopped in bathroom to void. Pt independent with toilet and hand hygiene with Supervision for safety.   Assessment/Plan    PT Assessment Patient needs continued PT services  PT Problem List Decreased strength;Decreased range of motion;Decreased activity tolerance;Decreased balance;Decreased mobility;Decreased knowledge of use of DME;Pain;Decreased knowledge of precautions       PT Treatment Interventions DME instruction;Gait training;Stair training;Functional mobility training;Therapeutic activities;Therapeutic exercise;Balance training;Patient/family education    PT Goals (Current goals can be found in the Care Plan section)  Acute Rehab PT Goals Patient Stated Goal: to go home PT Goal Formulation: With patient Time For Goal Achievement: 10/14/21 Potential to Achieve Goals: Good    Frequency BID     Co-evaluation  AM-PAC PT "6 Clicks" Mobility  Outcome Measure Help needed turning from your back to your side while in a flat bed without using bedrails?: None Help needed moving from lying on your back to sitting on the side of a flat bed without using bedrails?: A Little Help needed moving to and from a bed to a chair (including a wheelchair)?: A Little Help needed standing up from a chair using your arms (e.g., wheelchair or bedside chair)?: A Little Help needed to walk in hospital room?: A Little Help needed climbing 3-5 steps with a railing? : A Little 6 Click Score: 19    End of  Session Equipment Utilized During Treatment: Gait belt Activity Tolerance: Patient tolerated treatment well Patient left: in chair;with call bell/phone within reach;with chair alarm set;with family/visitor present (pt husband present for duration of session) Nurse Communication: Mobility status;Weight bearing status PT Visit Diagnosis: Muscle weakness (generalized) (M62.81);Other abnormalities of gait and mobility (R26.89);Pain Pain - Right/Left: Right Pain - part of body: Hip    Time: 0932-6712 PT Time Calculation (min) (ACUTE ONLY): 33 min   Charges:              Miosha Behe, SPT  Seairra Otani 09/30/2021, 11:06 AM

## 2021-09-30 NOTE — Evaluation (Signed)
Occupational Therapy Evaluation Patient Details Name: Cindy Barber MRN: [id number] DOB: March 26, 1951 Today's Date: 09/30/2021   History of Present Illness Pt is a 71 year old female s/p R anterior hip replacement 09/29/21. Significant PMH includes cervical spinal stenosis, cystocele and rectocele with complete uterovaginal prolapse, GERD, osteoarthritis cervical spine   Clinical Impression   Chart reviewed, pt greeted in bed agreeable to OT evaluation. Pt is alert and oriented x4, good awareness of current level of function. PTA pt was amb with RW due to pain, generally indep- MOD Iin ADL, husband assisted with IADL as needed. STS completed with CGA, short amb transfer to bedside chair with RW with supervision-CGA. Grooming tasks completed with SET UP. Pt husband will be available to assist 24/7 as needed. Pt is to be moving into house in approx 3 weeks with level entry, walk in shower with grab bars. Pt will discharge home to basement apartment at daughters house with level entry. Pt is left in bedside chair in care of PT. RN aware of pt status. OT will follow acutely, no OT follow up anticipated.      Recommendations for follow up therapy are one component of a multi-disciplinary discharge planning process, led by the attending physician.  Recommendations may be updated based on patient status, additional functional criteria and insurance authorization.   Follow Up Recommendations  No OT follow up    Assistance Recommended at Discharge Intermittent Supervision/Assistance  Patient can return home with the following A little help with bathing/dressing/bathroom;Assistance with cooking/housework;Assist for transportation    Functional Status Assessment  Patient has had a recent decline in their functional status and demonstrates the ability to make significant improvements in function in a reasonable and predictable amount of time.  Equipment Recommendations  BSC/3in1    Recommendations for  Other Services       Precautions / Restrictions Precautions Precautions: Anterior Hip Restrictions Weight Bearing Restrictions: Yes RLE Weight Bearing: Weight bearing as tolerated      Mobility Bed Mobility Overal bed mobility: Modified Independent             General bed mobility comments: with HOB raised    Transfers Overall transfer level: Needs assistance Equipment used: Rolling walker (2 wheels) Transfers: Sit to/from Stand Sit to Stand: Min guard           General transfer comment: vcs for technique      Balance Overall balance assessment: Needs assistance Sitting-balance support: Feet supported Sitting balance-Leahy Scale: Good     Standing balance support: Bilateral upper extremity supported, During functional activity, Reliant on assistive device for balance Standing balance-Leahy Scale: Good                             ADL either performed or assessed with clinical judgement   ADL Overall ADL's : Needs assistance/impaired     Grooming: Wash/dry face;Sitting;Set up               Lower Body Dressing: Moderate assistance Lower Body Dressing Details (indicate cue type and reason): anticipated Toilet Transfer: Supervision/safety;Min guard;Rolling walker (2 wheels) Toilet Transfer Details (indicate cue type and reason): simulated; short amb transfer to bedside chair         Functional mobility during ADLs: Supervision/safety;Min guard;Rolling walker (2 wheels)       Vision Patient Visual Report: No change from baseline       Perception     Praxis  Pertinent Vitals/Pain Pain Assessment Pain Assessment: 0-10 Pain Score: 2  Pain Location: R hip Pain Descriptors / Indicators: Discomfort Pain Intervention(s): Limited activity within patient's tolerance, Monitored during session, Repositioned     Hand Dominance     Extremity/Trunk Assessment Upper Extremity Assessment Upper Extremity Assessment: Overall WFL for  tasks assessed   Lower Extremity Assessment Lower Extremity Assessment: Overall WFL for tasks assessed   Cervical / Trunk Assessment Cervical / Trunk Assessment: Normal   Communication Communication Communication: No difficulties   Cognition Arousal/Alertness: Awake/alert Behavior During Therapy: WFL for tasks assessed/performed Overall Cognitive Status: Within Functional Limits for tasks assessed                                       General Comments  vss throughout, wound vac intact    Exercises Other Exercises Other Exercises: edu pt and husband role of OT, role of rehab, discharge recommendations, home safety, DME use, falls prevention   Shoulder Instructions      Home Living Family/patient expects to be discharged to:: Private residence Living Arrangements: Children Available Help at Discharge: Family;Available 24 hours/day Type of Home: House (basement level apartment at daugthers house, pt will be moving into newly built house in a approx 3 weeks) Home Access: Level entry     Home Layout: Multi-level;Able to live on main level with bedroom/bathroom (currently lives on basement level, not going upstairs)     Bathroom Shower/Tub: Producer, television/film/video: Standard Bathroom Accessibility: Yes   Home Equipment: Pharmacist, hospital (2 wheels);Cane - single point          Prior Functioning/Environment Prior Level of Function : Independent/Modified Independent             Mobility Comments: amb with RW the last month due to R hip pain, prior to that was amb with [facility] ADLs Comments: MOD I-I in all ADL; husband drives and assists with cooking and cleaning as needed        OT Problem List: Decreased activity tolerance      OT Treatment/Interventions: Self-care/ADL training;Patient/family education;DME and/or AE instruction;Therapeutic activities;Therapeutic exercise;Balance training    OT Goals(Current goals can be found in  the care plan section) Acute Rehab OT Goals Patient Stated Goal: go home OT Goal Formulation: With patient/family Time For Goal Achievement: 10/14/21 Potential to Achieve Goals: Good ADL Goals Pt Will Perform Grooming: standing;with modified independence Pt Will Perform Lower Body Dressing: sit to/from stand;with modified independence Pt Will Transfer to Toilet: with modified independence;ambulating  OT Frequency: Min 2X/week    Co-evaluation              AM-PAC OT "6 Clicks" Daily Activity     Outcome Measure Help from another person eating meals?: None Help from another person taking care of personal grooming?: None Help from another person toileting, which includes using toliet, bedpan, or urinal?: A Little Help from another person bathing (including washing, rinsing, drying)?: A Little Help from another person to put on and taking off regular upper body clothing?: None Help from another person to put on and taking off regular lower body clothing?: A Little 6 Click Score: 21   End of Session Equipment Utilized During Treatment: Gait belt;Rolling walker (2 wheels) Nurse Communication: Mobility status  Activity Tolerance: Patient tolerated treatment well Patient left: in chair;with call bell/phone within reach;with family/visitor present (hand off to PT)  OT  Visit Diagnosis: Unsteadiness on feet (R26.81)                Time: 6578-4696 OT Time Calculation (min): 17 min Charges:  OT General Charges $OT Visit: 1 Visit OT Evaluation $OT Eval Low Complexity: 1 Low  {Rasheida Broden, OTD OTR/L  09/30/21, 9:38 AM

## 2021-09-30 NOTE — Discharge Summary (Signed)
Physician Discharge Summary  Patient ID: Cindy Barber MRN: [id number] DOB/AGE: 1951-01-14 71 y.o.  Admit date: 09/29/2021 Discharge date: 09/30/2021  Admission Diagnoses:  Status post total hip replacement, right [Z96.641]   Discharge Diagnoses: Patient Active Problem List   Diagnosis Date Noted   Status post total hip replacement, right 09/29/2021    Past Medical History:  Diagnosis Date   Anemia    Anxiety    Asthma    Cervical spinal stenosis    Complication of anesthesia    slow to wake up   Cystocele and rectocele with complete uterovaginal prolapse 12/2020   Diverticulosis    GERD with esophagitis    Hepatic steatosis    Hyperlipidemia    Hypomagnesemia    Occipital neuralgia of left side    Osteoarthritis cervical spine    Pneumonia      Transfusion: none   Consultants (if any):   Discharged Condition: Improved  Hospital Course: Cindy Barber is an 71 y.o. female who was admitted 09/29/2021 with a diagnosis of Status post total hip replacement, right and went to the operating room on 09/29/2021 and underwent the above named procedures.    Surgeries: Procedure(s): TOTAL HIP ARTHROPLASTY ANTERIOR APPROACH on 09/29/2021 Patient tolerated the surgery well. Taken to PACU where she was stabilized and then transferred to the orthopedic floor.  Started on Lovenox 40 mg q 24 hrs. Foot pumps applied bilaterally at 80 mm. Heels elevated on bed with rolled towels. No evidence of DVT. Negative Homan. Physical therapy started on day #1 for gait training and transfer. OT started day #1 for ADL and assisted devices.  Patient's foley was d/c on day #1. Patient's IV was d/c on day #1.  On post op day #1 patient was stable and ready for discharge to home with HHPT.    She was given perioperative antibiotics:  Anti-infectives (From admission, onward)    Start     Dose/Rate Route Frequency Ordered Stop   09/29/21 1900  ceFAZolin (ANCEF) IVPB 2g/100 mL premix        2  g 200 mL/hr over 30 Minutes Intravenous Every 6 hours 09/29/21 1653 09/30/21 0138   09/29/21 1039  ceFAZolin (ANCEF) 2-4 GM/100ML-% IVPB       Note to Pharmacy: Guadalupe Dawn B: cabinet override      09/29/21 1039 09/29/21 1315   09/29/21 0600  ceFAZolin (ANCEF) IVPB 2g/100 mL premix        2 g 200 mL/hr over 30 Minutes Intravenous On call to O.R. 09/29/21 0015 09/29/21 1339     .  She was given sequential compression devices, early ambulation, and Lovenox TEDs for DVT prophylaxis.  She benefited maximally from the hospital stay and there were no complications.    Recent vital signs:  Vitals:   09/30/21 0504 09/30/21 0743  BP: 100/65 109/68  Pulse: 84 89  Resp: 17 16  Temp: 97.9 F (36.6 C) 97.7 F (36.5 C)  SpO2: 92% 91%    Recent laboratory studies:  Lab Results  Component Value Date   HGB 10.3 (L) 09/30/2021   HGB 11.7 (L) 09/29/2021   HGB 13.0 09/22/2021   Lab Results  Component Value Date   WBC 14.1 (H) 09/30/2021   PLT 184 09/30/2021   No results found for: "INR" Lab Results  Component Value Date   NA 137 09/30/2021   K 4.1 09/30/2021   CL 105 09/30/2021   CO2 26 09/30/2021   BUN 9 09/30/2021   CREATININE  0.56 09/30/2021   GLUCOSE 185 (H) 09/30/2021    Discharge Medications:   Allergies as of 09/30/2021       Reactions   Morphine Anaphylaxis   Gluten Meal Diarrhea, Nausea Only   Abdominal pain   Naproxen Diarrhea   Adderall [amphetamine-dextroamphetamine] Anxiety   paranoid   Ativan [lorazepam] Anxiety        Medication List     TAKE these medications    albuterol 108 (90 Base) MCG/ACT inhaler Commonly known as: VENTOLIN HFA Inhale 2 puffs into the lungs every 6 (six) hours as needed for wheezing or shortness of breath.   albuterol (2.5 MG/3ML) 0.083% nebulizer solution Commonly known as: PROVENTIL Take 2.5 mg by nebulization every 6 (six) hours as needed for wheezing or shortness of breath.   atorvastatin 10 MG tablet Commonly  known as: LIPITOR Take 10 mg by mouth at bedtime.   BIOTIN PO Take 1 capsule by mouth daily.   Breo Ellipta 200-25 MCG/ACT Aepb Generic drug: fluticasone furoate-vilanterol Inhale 1 puff into the lungs daily.   celecoxib 200 MG capsule Commonly known as: CELEBREX Take 200 mg by mouth 2 (two) times daily.   docusate sodium 100 MG capsule Commonly known as: COLACE Take 1 capsule (100 mg total) by mouth 2 (two) times daily.   enoxaparin 40 MG/0.4ML injection Commonly known as: LOVENOX Inject 0.4 mLs (40 mg total) into the skin daily for 14 days.   folic acid 400 MCG tablet Commonly known as: FOLVITE Take 400 mcg by mouth daily.   magnesium chloride 64 MG Tbec SR tablet Commonly known as: SLOW-MAG Take 2 tablets by mouth 2 (two) times daily.   methocarbamol 500 MG tablet Commonly known as: ROBAXIN Take 1 tablet (500 mg total) by mouth every 6 (six) hours as needed for muscle spasms.   MULTIPLE VITAMIN PO Take 1 tablet by mouth daily.   oxyCODONE 5 MG immediate release tablet Commonly known as: Oxy IR/ROXICODONE Take 1-2 tablets (5-10 mg total) by mouth every 4 (four) hours as needed for moderate pain (pain score 4-6).   pantoprazole 40 MG tablet Commonly known as: PROTONIX Take 40 mg by mouth daily.   traMADol 50 MG tablet Commonly known as: ULTRAM Take 1 tablet (50 mg total) by mouth every 6 (six) hours as needed. What changed:  when to take this reasons to take this additional instructions               Durable Medical Equipment  (From admission, onward)           Start     Ordered   09/29/21 1654  DME Walker rolling  Once       Question Answer Comment  Walker: With 5 Inch Wheels   Patient needs a walker to treat with the following condition Status post total hip replacement, right      09/29/21 1653   09/29/21 1654  DME 3 n 1  Once        09/29/21 1653   09/29/21 1654  DME Bedside commode  Once       Question:  Patient needs a bedside  commode to treat with the following condition  Answer:  Status post total hip replacement, right   09/29/21 1653            Diagnostic Studies: DG HIP UNILAT W OR W/O PELVIS 2-3 VIEWS RIGHT  Result Date: 09/29/2021 CLINICAL DATA:  Postop EXAM: DG HIP (WITH OR WITHOUT PELVIS) 2-3V RIGHT COMPARISON:  08/13/2021 FINDINGS: Status post right hip replacement with intact hardware and normal alignment. Gas in the soft tissues consistent with recent surgery. IMPRESSION: Status post right hip replacement with expected postsurgical change Electronically Signed   By: Jasmine Pang M.D.   On: 09/29/2021 15:35   DG C-Arm 1-60 Min-No Report  Result Date: 09/29/2021 Fluoroscopy was utilized by the requesting physician.  No radiographic interpretation.   DG C-Arm 1-60 Min-No Report  Result Date: 09/29/2021 Fluoroscopy was utilized by the requesting physician.  No radiographic interpretation.    Disposition:      Follow-up Information     Evon Slack, PA-C Follow up in 2 week(s).   Specialties: Orthopedic Surgery, Emergency Medicine Contact information: [address] [place] [address] [phone]                  Signed: Patience Musca 09/30/2021, 7:55 AM

## 2021-09-30 NOTE — Progress Notes (Signed)
Physical Therapy Treatment Patient Details Name: Cindy Barber MRN: [id number] DOB: 03/25/1951 Today's Date: 09/30/2021   History of Present Illness Pt is a 71 year old female s/p R anterior hip replacement 09/29/21. Significant PMH includes cervical spinal stenosis, cystocele and rectocele with complete uterovaginal prolapse, GERD, osteoarthritis cervical spine    PT Comments    Pt politely declined completing supine there-ex and stated she can read the booklet and will complete her exercises at home. Pt requested to try stairs instead. Pt education provided on proper sequencing of steps with pt able to verbalize and demonstrate ability to complete safely. Pt ascended/descended 4 steps with bilat rails and CGA. Pt ambulated 60 feet to/from steps with RW and Supervision. Pt reported pain at 6-7/10 in R hip during ambulation. Continue to recommend [facility] PT at discharge.   Recommendations for follow up therapy are one component of a multi-disciplinary discharge planning process, led by the attending physician.  Recommendations may be updated based on patient status, additional functional criteria and insurance authorization.  Follow Up Recommendations  Home health PT     Assistance Recommended at Discharge Intermittent Supervision/Assistance  Patient can return home with the following A little help with walking and/or transfers;A little help with bathing/dressing/bathroom;Assistance with cooking/housework;Assist for transportation;Help with stairs or ramp for entrance   Equipment Recommendations  BSC/3in1    Recommendations for Other Services       Precautions / Restrictions Precautions Precautions: Anterior Hip Restrictions Weight Bearing Restrictions: Yes RLE Weight Bearing: Weight bearing as tolerated     Mobility  Bed Mobility Overal bed mobility: Modified Independent             General bed mobility comments: Pt required increased time and effort to complete task; utilized R  UE to lift R LE off of bed during supine to sit transfer    Transfers Overall transfer level: Needs assistance Equipment used: Rolling walker (2 wheels) Transfers: Sit to/from Stand Sit to Stand: Supervision           General transfer comment: verbal cues for technique/hand placement    Ambulation/Gait Ambulation/Gait assistance: Supervision Gait Distance (Feet): 60 Feet Assistive device: Rolling walker (2 wheels) Gait Pattern/deviations: Step-through pattern, Decreased weight shift to right, Antalgic       General Gait Details: Ambulated with continuous gait pattern with RW and Supervision with good WB tolerance through R LE   Stairs Stairs: Yes Stairs assistance: Min guard Stair Management: Two rails, Step to pattern, Forwards Number of Stairs: 4 General stair comments: Education on proper sequencing and technique with pt able to verbalize and demonstrate stairs safely with bilat rails and step-to pattern   Wheelchair Mobility    Modified Rankin (Stroke Patients Only)       Balance Overall balance assessment: Needs assistance Sitting-balance support: Feet supported Sitting balance-Leahy Scale: Good Sitting balance - Comments: Pt able to sit in chair with back unsupported and feet placed on floor without UE support. No LOB.   Standing balance support: Bilateral upper extremity supported, During functional activity Standing balance-Leahy Scale: Good Standing balance comment: Pt utilized RW with bilat UE support during ambulation                            Cognition Arousal/Alertness: Awake/alert Behavior During Therapy: WFL for tasks assessed/performed Overall Cognitive Status: Within Functional Limits for tasks assessed  Exercises Total Joint Exercises Ankle Circles/Pumps: AROM, Both, 10 reps, Seated Gluteal Sets: AROM, Strengthening, Both, 10 reps, Seated Long Arc Quad: AROM,  Strengthening, Both, 10 reps, Seated Knee Flexion: AROM, Strengthening, Right, 10 reps, Seated Other Exercises Other Exercises: Following ambulation in hallway, pt stopped in bathroom to void. Pt independent with toilet and hand hygiene with Supervision for safety.    General Comments        Pertinent Vitals/Pain Pain Assessment Pain Assessment: 0-10 Pain Score: 6  Pain Location: R hip Pain Descriptors / Indicators: Discomfort Pain Intervention(s): Limited activity within patient's tolerance, Monitored during session, Repositioned, Premedicated before session    Home Living Family/patient expects to be discharged to:: Private residence Living Arrangements: Children Available Help at Discharge: Family;Available 24 hours/day Type of Home: House (basement level apartment at daugthers house, pt will be moving into newly built house in a approx 3 weeks) Home Access: Level entry       Home Layout: Multi-level;Able to live on main level with bedroom/bathroom (able to drive up to both levels of home with level entry if needed) Home Equipment: Shower seat;Rolling Walker (2 wheels);Cane - single point Additional Comments: Pt plans to complete 2 weeks of HH in daughter's home prior to transitioning to new home    Prior Function            PT Goals (current goals can now be found in the care plan section) Acute Rehab PT Goals Patient Stated Goal: to go home PT Goal Formulation: With patient Time For Goal Achievement: 10/14/21 Potential to Achieve Goals: Good Additional Goals Additional Goal #1: Pt will perform bed mobility from a flat bed and transfers mod I to demonstrate increased independence with functional mobility tasks in the home. Progress towards PT goals: Progressing toward goals    Frequency    BID      PT Plan Current plan remains appropriate    Co-evaluation              AM-PAC PT "6 Clicks" Mobility   Outcome Measure  Help needed turning from your  back to your side while in a flat bed without using bedrails?: None Help needed moving from lying on your back to sitting on the side of a flat bed without using bedrails?: None Help needed moving to and from a bed to a chair (including a wheelchair)?: A Little Help needed standing up from a chair using your arms (e.g., wheelchair or bedside chair)?: A Little Help needed to walk in hospital room?: A Little Help needed climbing 3-5 steps with a railing? : A Little 6 Click Score: 20    End of Session Equipment Utilized During Treatment: Gait belt Activity Tolerance: Patient tolerated treatment well Patient left: in chair;with family/visitor present;with call bell/phone within reach;Other (comment) (pt awaiting w/c to discharge) Nurse Communication: Mobility status PT Visit Diagnosis: Muscle weakness (generalized) (M62.81);Other abnormalities of gait and mobility (R26.89);Pain Pain - Right/Left: Right Pain - part of body: Hip     Time: 6269-4854 PT Time Calculation (min) (ACUTE ONLY): 12 min  Charges:                     Radford Pax, SPT  Yaasir Menken 09/30/2021, 2:17 PM

## 2021-10-01 LAB — SURGICAL PATHOLOGY

## 2022-02-10 ENCOUNTER — Other Ambulatory Visit: Payer: Self-pay | Admitting: Orthopedic Surgery

## 2022-02-10 DIAGNOSIS — Z96649 Presence of unspecified artificial hip joint: Secondary | ICD-10-CM

## 2022-02-10 DIAGNOSIS — Z96641 Presence of right artificial hip joint: Secondary | ICD-10-CM

## 2022-02-16 ENCOUNTER — Encounter
Admission: RE | Admit: 2022-02-16 | Discharge: 2022-02-16 | Disposition: A | Discharge status: Home / Self Care | Payer: Medicare HMO | Source: Ambulatory Visit | Attending: Orthopedic Surgery | Admitting: Orthopedic Surgery

## 2022-02-16 DIAGNOSIS — Z96649 Presence of unspecified artificial hip joint: Secondary | ICD-10-CM | POA: Diagnosis present

## 2022-02-16 DIAGNOSIS — Z96641 Presence of right artificial hip joint: Secondary | ICD-10-CM

## 2022-02-16 DIAGNOSIS — T8484XA Pain due to internal orthopedic prosthetic devices, implants and grafts, initial encounter: Secondary | ICD-10-CM | POA: Diagnosis present

## 2022-02-16 MED ORDER — TECHNETIUM TC 99M MEDRONATE IV KIT
20.0000 | PACK | Freq: Once | INTRAVENOUS | Status: AC | PRN
  Administered 2022-02-16: 21.79 via INTRAVENOUS

## 2022-02-19 ENCOUNTER — Other Ambulatory Visit: Payer: Self-pay | Admitting: Orthopedic Surgery

## 2022-02-19 DIAGNOSIS — M87052 Idiopathic aseptic necrosis of left femur: Secondary | ICD-10-CM

## 2022-05-24 IMAGING — MR MR HIP*R* W/O CM
4 of 5 series · 26 of 40 positions shown · non-contrast
Comparison: None available. Report only from outside radiographs
08/13/2021.

CLINICAL DATA: Right hip pain for 7 months after surgery for
vaginal prolapse. Concern for stress fracture.

EXAM:
MR OF THE RIGHT HIP WITHOUT CONTRAST
TECHNIQUE: Multiplanar, multisequence MR imaging was performed. No intravenous
contrast was administered.

[Series 2: T1 · coronal · right · 4.0mm · 0.59mm/px · 5 of 38 slices shown]
[im 1/38]
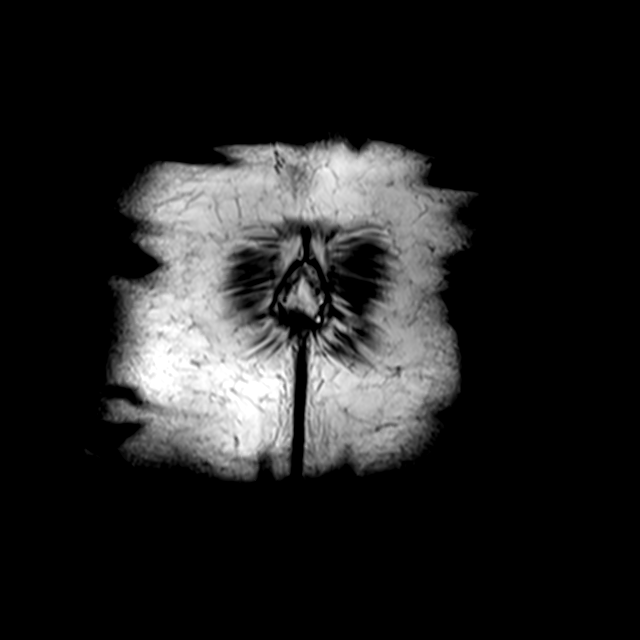
[im 5/38]
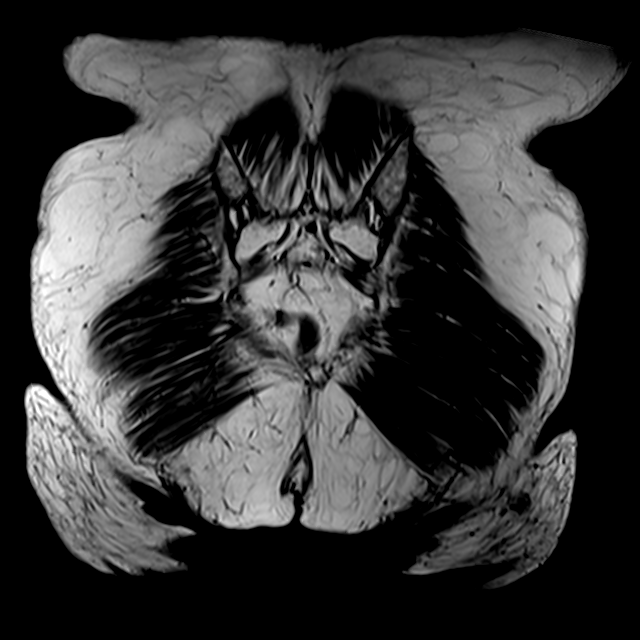
[im 13/38]
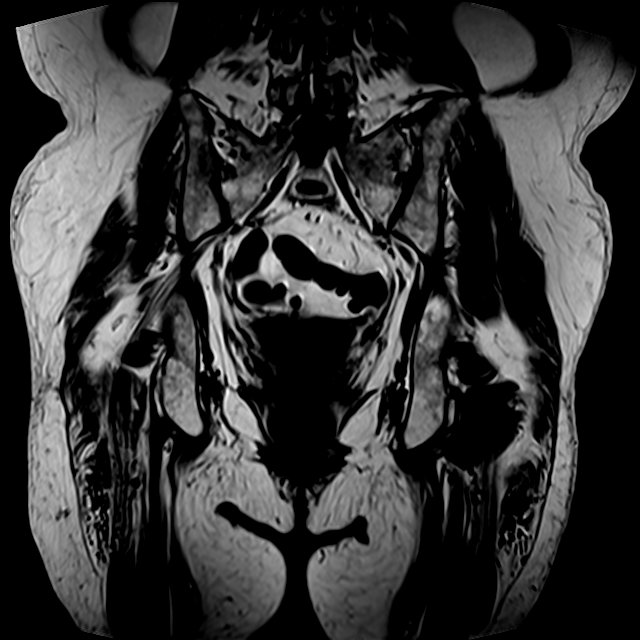
[im 21/38]
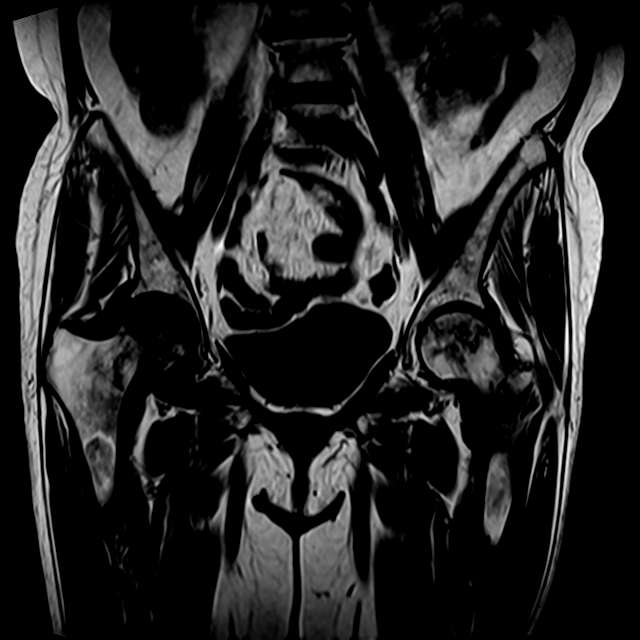
[im 33/38]
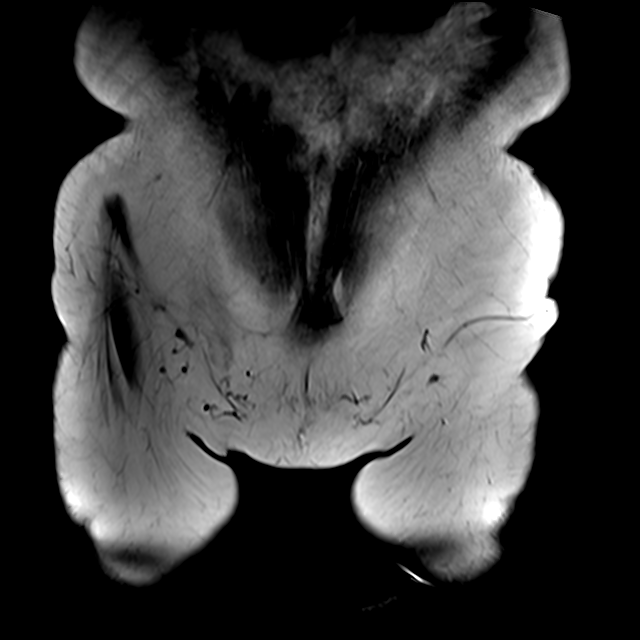

[Series 4: T2 fat-sat · axial · right · 4.0mm · 0.70mm/px · z∈[-171,-26]mm · 7 of 30 slices shown]
[im 1/30]
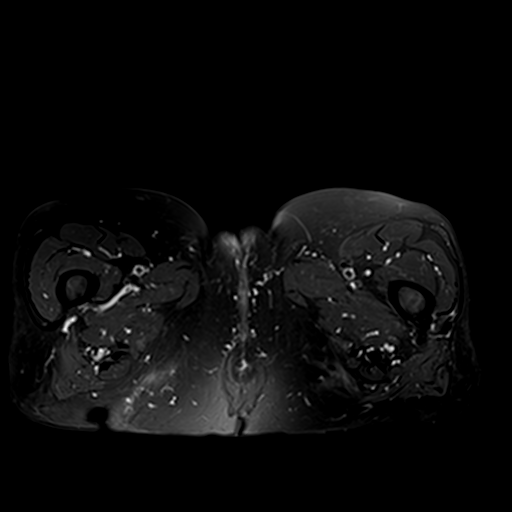
[im 5/30]
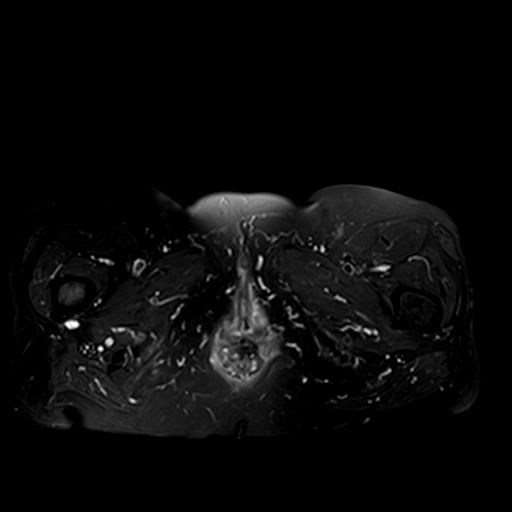
[im 10/30]
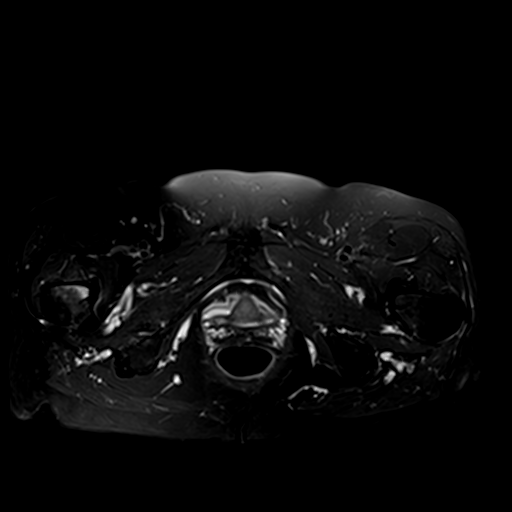
[im 15/30]
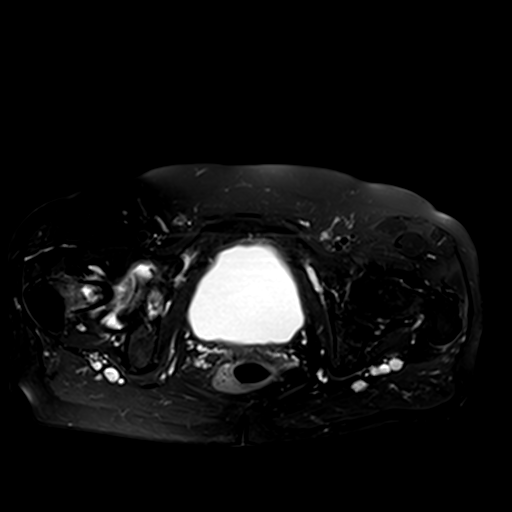
[im 20/30]
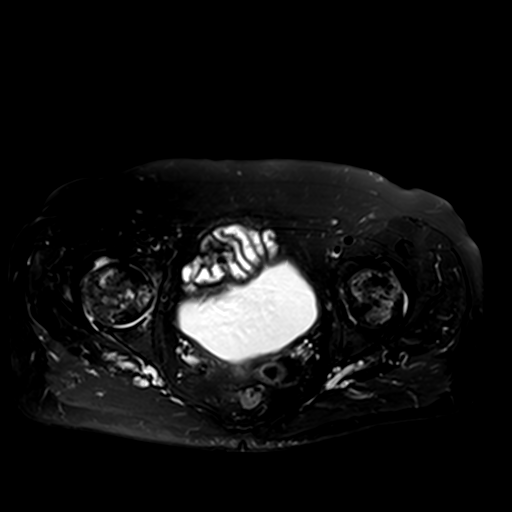
[im 25/30]
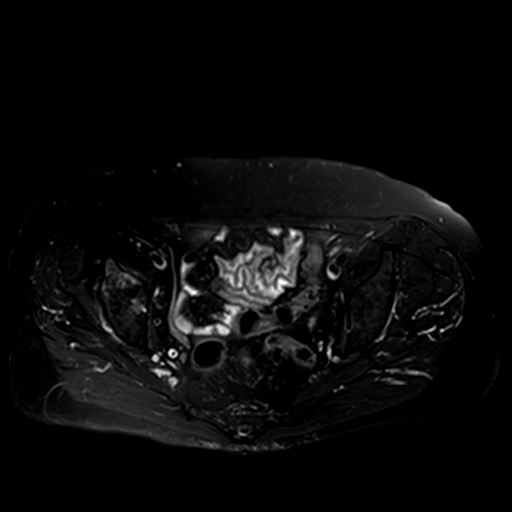
[im 30/30]
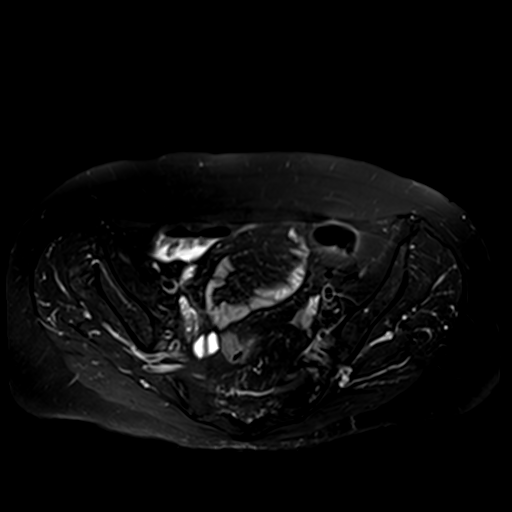

[Series 5: PD fat-sat · sagittal · right · 4.0mm · 0.70mm/px · 7 of 28 slices shown (1 of 2)]
[im 1/28]
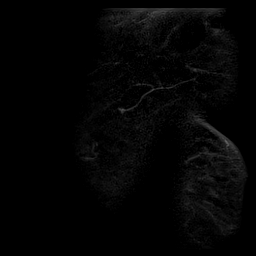
[im 5/28]
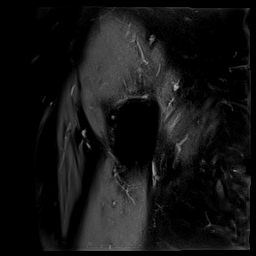
[im 10/28]
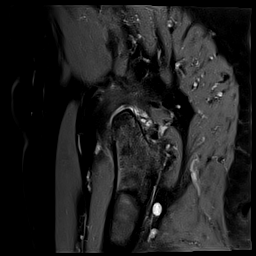
[im 14/28]
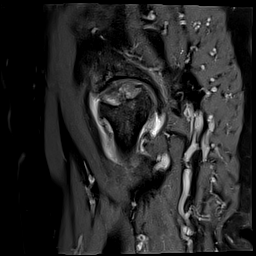
[im 19/28]
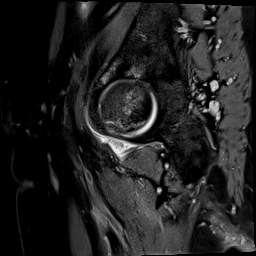
[im 23/28]
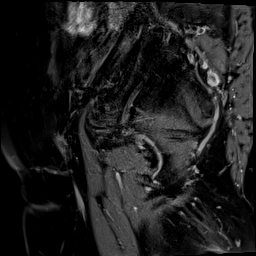
[im 28/28]
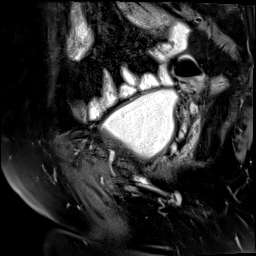

[Series 6: PD fat-sat · coronal · right · 4.0mm · 0.70mm/px · 7 of 29 slices shown (2 of 2)]
[im 1/29]
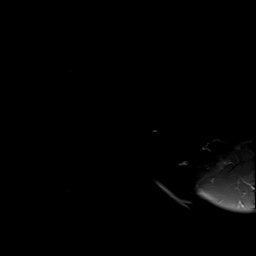
[im 5/29]
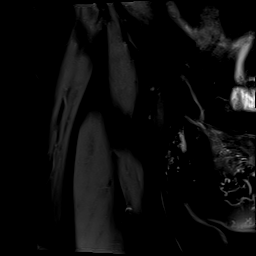
[im 10/29]
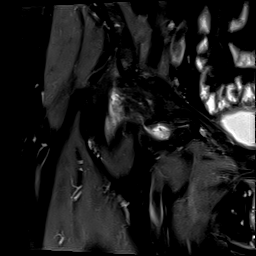
[im 15/29]
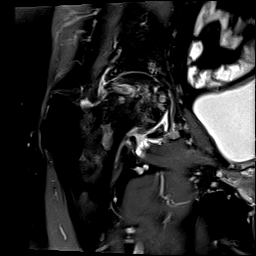
[im 19/29]
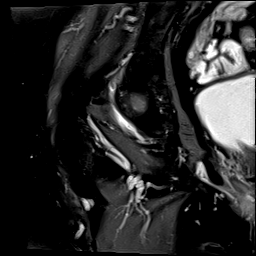
[im 24/29]
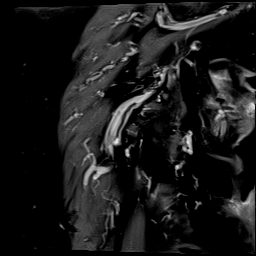
[im 29/29]
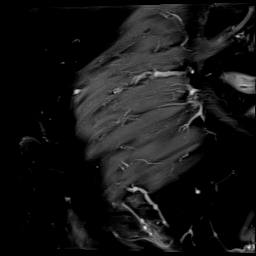

[26 of 40 positions shown; findings below may reference images not displayed]

FINDINGS: Bones: There is bilateral femoral head osteonecrosis. Serpiginous
low T1 and high T2 signal is present superiorly in both femoral
heads. On the right, there is surrounding marrow edema which extends
into the femoral neck. No discrete fracture identified. Possible
early subchondral collapse of the right femoral head with secondary
degenerative changes at the right hip. The visualized bony pelvis
appears normal. The visualized sacroiliac joints and symphysis pubis
appear normal. Degenerative disc disease at L5-S1.

Articular cartilage and labrum

Articular cartilage: Moderate right and mild left hip degenerative
changes.

Labrum: Labral degeneration without evidence of tear.

Joint or bursal effusion

Joint effusion: Moderate-sized right hip joint effusion. No
significant joint effusion on the left.

Bursae: No focal periarticular fluid collection.

Muscles and tendons

Muscles and tendons: The visualized gluteus, hamstring and iliopsoas
tendons appear normal. The piriformis muscles appear symmetric.

Other findings

Miscellaneous: Sigmoid diverticulosis. Previous hysterectomy. The
visualized internal pelvic contents otherwise appear unremarkable.
IMPRESSION: 1. Bilateral femoral head osteonecrosis, worse on the right. On the
right, there is associated marrow edema extending to the femoral
neck, a moderate-sized joint effusion and possible early subchondral
collapse of the femoral head with secondary right hip degenerative
changes.
2. No evidence of acute fracture.
3. Degenerative disc disease at L5-S1.
4. The pelvic and hip musculature appears normal.

## 2022-09-15 ENCOUNTER — Ambulatory Visit
Admission: RE | Admit: 2022-09-15 | Discharge: 2022-09-15 | Disposition: A | Discharge status: Home / Self Care | Payer: Medicare HMO | Source: Ambulatory Visit | Attending: Orthopedic Surgery | Admitting: Orthopedic Surgery

## 2022-09-15 ENCOUNTER — Other Ambulatory Visit: Payer: Self-pay | Admitting: Orthopedic Surgery

## 2022-09-15 DIAGNOSIS — Z96641 Presence of right artificial hip joint: Secondary | ICD-10-CM | POA: Diagnosis present

## 2022-09-15 DIAGNOSIS — T8484XA Pain due to internal orthopedic prosthetic devices, implants and grafts, initial encounter: Secondary | ICD-10-CM | POA: Insufficient documentation

## 2022-09-15 DIAGNOSIS — Z96649 Presence of unspecified artificial hip joint: Secondary | ICD-10-CM | POA: Diagnosis present

## 2022-09-29 ENCOUNTER — Other Ambulatory Visit: Payer: Self-pay

## 2022-09-29 ENCOUNTER — Inpatient Hospital Stay
Admission: RE | Admit: 2022-09-29 | Discharge: 2022-09-29 | Disposition: A | Discharge status: Home / Self Care | Payer: Self-pay | Source: Ambulatory Visit | Attending: Neurosurgery | Admitting: Neurosurgery

## 2022-09-29 DIAGNOSIS — Z049 Encounter for examination and observation for unspecified reason: Secondary | ICD-10-CM

## 2022-10-19 ENCOUNTER — Ambulatory Visit: Payer: Medicare HMO | Admitting: Neurosurgery

## 2023-03-01 ENCOUNTER — Other Ambulatory Visit: Payer: Self-pay | Admitting: Physical Medicine & Rehabilitation

## 2023-03-01 DIAGNOSIS — M5416 Radiculopathy, lumbar region: Secondary | ICD-10-CM

## 2023-03-15 ENCOUNTER — Encounter: Payer: Self-pay | Admitting: Physical Medicine & Rehabilitation

## 2023-03-17 ENCOUNTER — Ambulatory Visit
Admission: RE | Admit: 2023-03-17 | Discharge: 2023-03-17 | Disposition: A | Discharge status: Home / Self Care | Payer: Medicare HMO | Source: Ambulatory Visit | Attending: Physical Medicine & Rehabilitation | Admitting: Physical Medicine & Rehabilitation

## 2023-03-17 DIAGNOSIS — M5416 Radiculopathy, lumbar region: Secondary | ICD-10-CM

## 2023-06-22 NOTE — H&P (View-Only) (Signed)
 Referring Physician:  Lenice Llamas, MD No address on file  Primary Physician:  Lenice Llamas, MD  History of Present Illness: 06/23/2023 [name] is here today with a chief complaint of  worsening right buttock and lateral thigh pain for the past two years. The pain, described as a burning sensation, originates in the right buttock and radiates down the lateral aspect of the thigh to the knee. The pain is exacerbated by sitting and walking, and is relieved by lying flat. The patient reports that she can only walk about ten feet before needing to stop due to pain. She also reports a slow, shuffling gait and difficulty lifting her feet. The patient denies any numbness or tingling in the thigh, but reports a burning sensation when touched. She has had several injections and physical therapy in November 2024, but these interventions did not provide significant relief. The patient also reports pain in the groin. She has had a hip CT and MRI, which did not reveal any significant abnormalities. The patient also reports a history of a neck fusion, which is doing well, but still experiences chronic neck pain from a previous break. Bowel/Bladder Dysfunction: none  Conservative measures:  Physical therapy: has participated in PT back in November 2024 at [facility] in [place] Multimodal medical therapy including regular antiinflammatories: Gabapentin, Meloxicam, Celebrex, Oxycodone, Tramadol Injections:  03/11/22: right L5-S1 TFESI with 50% improvement 04/23/22: right L5-S1 TFESI with 70% improvement 10/22/22: right L5-S1 TFESI with 100% improvement 01/24/23: right L5-S1 TFESI with 75% improvement   Past Surgery:  06/04/2019 C1-2 Posterior Spinal Fusion, 2 other surgeries on her neck  Cindy Barber has no symptoms of cervical myelopathy.  The symptoms are causing a significant impact on the patient's life.   I have utilized the care everywhere function in epic to review the outside records  available from external health systems.  Review of Systems:  A 10 point review of systems is negative, except for the pertinent positives and negatives detailed in the HPI.  Past Medical History: Past Medical History:  Diagnosis Date   Anemia    Anxiety    Asthma    Cervical spinal stenosis    Complication of anesthesia    slow to wake up   Cystocele and rectocele with complete uterovaginal prolapse 12/2020   Diverticulosis    GERD with esophagitis    Hepatic steatosis    Hyperlipidemia    Hypomagnesemia    Occipital neuralgia of left side    Osteoarthritis cervical spine    Pneumonia     Past Surgical History: Past Surgical History:  Procedure Laterality Date   ANTERIOR CERVICAL DECOMP/DISCECTOMY FUSION  10/31/2005   C2 fracture   COLONOSCOPY     DUPUYTREN CONTRACTURE RELEASE Right 2018   LAPAROSCOPIC CHOLECYSTECTOMY  2006   PERCUTANEOUS LIVER BIOPSY  01/17/2020   POSTERIOR FUSION CERVICAL SPINE  06/04/2019   C1-2   POSTERIOR LAMINECTOMY / DECOMPRESSION CERVICAL SPINE  2008   TOTAL HIP ARTHROPLASTY Right 09/29/2021   Procedure: TOTAL HIP ARTHROPLASTY ANTERIOR APPROACH;  Surgeon: Kennedy Bucker, MD;  Location: [facility] ORS;  Service: Orthopedics;  Laterality: Right;   TUBAL LIGATION  1984   VAGINAL HYSTERECTOMY  12/22/2020   with sling for stress incontinence    Allergies: Allergies as of 06/23/2023 - Review Complete 09/29/2021  Allergen Reaction Noted   Morphine Anaphylaxis 09/22/2021   Gluten meal Diarrhea and Nausea Only 09/22/2021   Naproxen Diarrhea 09/22/2021   Adderall [amphetamine-dextroamphetamine] Anxiety 09/22/2021   Ativan [  lorazepam] Anxiety 09/22/2021    Medications:  Current Outpatient Medications:    albuterol (PROVENTIL) (2.5 MG/3ML) 0.083% nebulizer solution, Take 2.5 mg by nebulization every 6 (six) hours as needed for wheezing or shortness of breath., Disp: , Rfl:    albuterol (VENTOLIN HFA) 108 (90 Base) MCG/ACT inhaler, Inhale 2 puffs into the  lungs every 6 (six) hours as needed for wheezing or shortness of breath., Disp: , Rfl:    atorvastatin (LIPITOR) 10 MG tablet, Take 10 mg by mouth at bedtime., Disp: , Rfl:    BIOTIN PO, Take 1 capsule by mouth daily., Disp: , Rfl:    celecoxib (CELEBREX) 200 MG capsule, Take 200 mg by mouth 2 (two) times daily., Disp: , Rfl:    docusate sodium (COLACE) 100 MG capsule, Take 1 capsule (100 mg total) by mouth 2 (two) times daily., Disp: 10 capsule, Rfl: 0   enoxaparin (LOVENOX) 40 MG/0.4ML injection, Inject 0.4 mLs (40 mg total) into the skin daily for 14 days., Disp: 5.6 mL, Rfl: 0   fluticasone furoate-vilanterol (BREO ELLIPTA) 200-25 MCG/ACT AEPB, Inhale 1 puff into the lungs daily., Disp: , Rfl:    folic acid (FOLVITE) 400 MCG tablet, Take 400 mcg by mouth daily., Disp: , Rfl:    magnesium chloride (SLOW-MAG) 64 MG TBEC SR tablet, Take 2 tablets by mouth 2 (two) times daily., Disp: , Rfl:    methocarbamol (ROBAXIN) 500 MG tablet, Take 1 tablet (500 mg total) by mouth every 6 (six) hours as needed for muscle spasms., Disp: 30 tablet, Rfl: 0   MULTIPLE VITAMIN PO, Take 1 tablet by mouth daily., Disp: , Rfl:    oxyCODONE (OXY IR/ROXICODONE) 5 MG immediate release tablet, Take 1-2 tablets (5-10 mg total) by mouth every 4 (four) hours as needed for moderate pain (pain score 4-6)., Disp: 30 tablet, Rfl: 0   pantoprazole (PROTONIX) 40 MG tablet, Take 40 mg by mouth daily., Disp: , Rfl:    traMADol (ULTRAM) 50 MG tablet, Take 1 tablet (50 mg total) by mouth every 6 (six) hours as needed., Disp: 30 tablet, Rfl: 0  Social History: Social History   Tobacco Use   Smoking status: Never   Smokeless tobacco: Never  Vaping Use   Vaping status: Never Used  Substance Use Topics   Alcohol use: Yes    Comment: occassional   Drug use: Never    Family Medical History: No family history on file.  Physical Examination: There were no vitals filed for this visit.  General: Patient is in no apparent  distress. Attention to examination is appropriate.  Neck:   Supple.  Full range of motion.  Respiratory: Patient is breathing without any difficulty.   NEUROLOGICAL:     Awake, alert, oriented to person, place, and time.  Speech is clear and fluent.   Cranial Nerves: Pupils equal round and reactive to light.  Facial tone is symmetric.  Facial sensation is symmetric. Shoulder shrug is symmetric. Tongue protrusion is midline.  There is no pronator drift.  Strength: Side Biceps Triceps Deltoid Interossei Grip Wrist Ext. Wrist Flex.  R 5 5 5 5 5 5 5   L 5 5 5 5 5 5 5    Side Iliopsoas Quads Hamstring PF DF EHL  R 4- 5 5 5 5 5   L 5 5 5 5 5 5    Reflexes are 1+ and symmetric at the biceps, triceps, brachioradialis, patella and achilles.   Hoffman's is absent.   Bilateral upper and lower extremity sensation  is intact to light touch with exception of R L3 distribution hypesthesia.    No evidence of dysmetria noted.  Gait is abnormal -  requires walker.     Medical Decision Making  Imaging: MRI L spine 03/17/2023 IMPRESSION: 1. L3-4: Bulging of the disc more prominent in the right posterolateral direction. Mild narrowing of the right lateral recess, new since 2023. Some potential this could focally irritate the right L4 nerve, though definite nerve compression is not demonstrated. 2. L5-S1: Chronic disc degeneration with loss of disc height and endplate osteophytes. No compressive stenosis. No change since 2023. 3. L1-2: Bulging of the disc in the right posterolateral direction. No compressive stenosis. No change since 2023. 4. L2-3: Minimal annular bulging. No compressive stenosis. No change since 2023.     Electronically Signed   By: Paulina Fusi M.D.   On: 03/31/2023 09:40    I have personally reviewed the images and agree with the above interpretation.  Assessment and Plan: Ms. Ferrando is a pleasant 73 y.o. female with right leg pain that could be the result of right L4  radiculopathy.  She has an EMG that confirms this etiology.  We discussed the possibilities.  She would like to move forward with right-sided L3-4 lateral recess decompression via laminoforaminotomy.  Right L4 radiculopathy Chronic right L4 radiculopathy with burning pain in the right buttock, lateral thigh, and anterior thigh, exacerbated by sitting and walking. MRI indicates potential nerve root compression at L4, correlating with EMG findings. Decision to proceed with decompression surgery due to positive EMG and partial response to injections. Surgical risks include bleeding, infection, and major medical events. Anticipated outcome is a 50% chance of improvement, with potential need for spinal cord stimulator if decompression is ineffective. - Schedule decompression surgery for right L4 nerve root. - Consider spinal cord stimulator evaluation if decompression surgery is ineffective.   I discussed the planned procedure at length with the patient, including the risks, benefits, alternatives, and indications. The risks discussed include but are not limited to bleeding, infection, need for reoperation, spinal fluid leak, stroke, vision loss, anesthetic complication, coma, paralysis, and even death. I also described in detail that improvement was not guaranteed.  The patient expressed understanding of these risks, and asked that we proceed with surgery. I described the surgery in layman's terms, and gave ample opportunity for questions, which were answered to the best of my ability.  I spent a total of 30 minutes in this patient's care today. This time was spent reviewing pertinent records including imaging studies, obtaining and confirming history, performing a directed evaluation, formulating and discussing my recommendations, and documenting the visit within the medical record.   Thank you for involving me in the care of this patient.      Jerine Surles K. Myer Haff MD, [facility] Neurosurgery

## 2023-06-22 NOTE — Progress Notes (Unsigned)
 Referring Physician:  Lenice Llamas, MD No address on file  Primary Physician:  Lenice Llamas, MD  History of Present Illness: 06/23/2023 [name] is here today with a chief complaint of  worsening right buttock and lateral thigh pain for the past two years. The pain, described as a burning sensation, originates in the right buttock and radiates down the lateral aspect of the thigh to the knee. The pain is exacerbated by sitting and walking, and is relieved by lying flat. The patient reports that she can only walk about ten feet before needing to stop due to pain. She also reports a slow, shuffling gait and difficulty lifting her feet. The patient denies any numbness or tingling in the thigh, but reports a burning sensation when touched. She has had several injections and physical therapy in November 2024, but these interventions did not provide significant relief. The patient also reports pain in the groin. She has had a hip CT and MRI, which did not reveal any significant abnormalities. The patient also reports a history of a neck fusion, which is doing well, but still experiences chronic neck pain from a previous break. Bowel/Bladder Dysfunction: none  Conservative measures:  Physical therapy: has participated in PT back in November 2024 at [facility] in [place] Multimodal medical therapy including regular antiinflammatories: Gabapentin, Meloxicam, Celebrex, Oxycodone, Tramadol Injections:  03/11/22: right L5-S1 TFESI with 50% improvement 04/23/22: right L5-S1 TFESI with 70% improvement 10/22/22: right L5-S1 TFESI with 100% improvement 01/24/23: right L5-S1 TFESI with 75% improvement   Past Surgery:  06/04/2019 C1-2 Posterior Spinal Fusion, 2 other surgeries on her neck  Defne Gerling has no symptoms of cervical myelopathy.  The symptoms are causing a significant impact on the patient's life.   I have utilized the care everywhere function in epic to review the outside records  available from external health systems.  Review of Systems:  A 10 point review of systems is negative, except for the pertinent positives and negatives detailed in the HPI.  Past Medical History: Past Medical History:  Diagnosis Date   Anemia    Anxiety    Asthma    Cervical spinal stenosis    Complication of anesthesia    slow to wake up   Cystocele and rectocele with complete uterovaginal prolapse 12/2020   Diverticulosis    GERD with esophagitis    Hepatic steatosis    Hyperlipidemia    Hypomagnesemia    Occipital neuralgia of left side    Osteoarthritis cervical spine    Pneumonia     Past Surgical History: Past Surgical History:  Procedure Laterality Date   ANTERIOR CERVICAL DECOMP/DISCECTOMY FUSION  10/31/2005   C2 fracture   COLONOSCOPY     DUPUYTREN CONTRACTURE RELEASE Right 2018   LAPAROSCOPIC CHOLECYSTECTOMY  2006   PERCUTANEOUS LIVER BIOPSY  01/17/2020   POSTERIOR FUSION CERVICAL SPINE  06/04/2019   C1-2   POSTERIOR LAMINECTOMY / DECOMPRESSION CERVICAL SPINE  2008   TOTAL HIP ARTHROPLASTY Right 09/29/2021   Procedure: TOTAL HIP ARTHROPLASTY ANTERIOR APPROACH;  Surgeon: Kennedy Bucker, MD;  Location: [facility] ORS;  Service: Orthopedics;  Laterality: Right;   TUBAL LIGATION  1984   VAGINAL HYSTERECTOMY  12/22/2020   with sling for stress incontinence    Allergies: Allergies as of 06/23/2023 - Review Complete 09/29/2021  Allergen Reaction Noted   Morphine Anaphylaxis 09/22/2021   Gluten meal Diarrhea and Nausea Only 09/22/2021   Naproxen Diarrhea 09/22/2021   Adderall [amphetamine-dextroamphetamine] Anxiety 09/22/2021   Ativan [  lorazepam] Anxiety 09/22/2021    Medications:  Current Outpatient Medications:    albuterol (PROVENTIL) (2.5 MG/3ML) 0.083% nebulizer solution, Take 2.5 mg by nebulization every 6 (six) hours as needed for wheezing or shortness of breath., Disp: , Rfl:    albuterol (VENTOLIN HFA) 108 (90 Base) MCG/ACT inhaler, Inhale 2 puffs into the  lungs every 6 (six) hours as needed for wheezing or shortness of breath., Disp: , Rfl:    atorvastatin (LIPITOR) 10 MG tablet, Take 10 mg by mouth at bedtime., Disp: , Rfl:    BIOTIN PO, Take 1 capsule by mouth daily., Disp: , Rfl:    celecoxib (CELEBREX) 200 MG capsule, Take 200 mg by mouth 2 (two) times daily., Disp: , Rfl:    docusate sodium (COLACE) 100 MG capsule, Take 1 capsule (100 mg total) by mouth 2 (two) times daily., Disp: 10 capsule, Rfl: 0   enoxaparin (LOVENOX) 40 MG/0.4ML injection, Inject 0.4 mLs (40 mg total) into the skin daily for 14 days., Disp: 5.6 mL, Rfl: 0   fluticasone furoate-vilanterol (BREO ELLIPTA) 200-25 MCG/ACT AEPB, Inhale 1 puff into the lungs daily., Disp: , Rfl:    folic acid (FOLVITE) 400 MCG tablet, Take 400 mcg by mouth daily., Disp: , Rfl:    magnesium chloride (SLOW-MAG) 64 MG TBEC SR tablet, Take 2 tablets by mouth 2 (two) times daily., Disp: , Rfl:    methocarbamol (ROBAXIN) 500 MG tablet, Take 1 tablet (500 mg total) by mouth every 6 (six) hours as needed for muscle spasms., Disp: 30 tablet, Rfl: 0   MULTIPLE VITAMIN PO, Take 1 tablet by mouth daily., Disp: , Rfl:    oxyCODONE (OXY IR/ROXICODONE) 5 MG immediate release tablet, Take 1-2 tablets (5-10 mg total) by mouth every 4 (four) hours as needed for moderate pain (pain score 4-6)., Disp: 30 tablet, Rfl: 0   pantoprazole (PROTONIX) 40 MG tablet, Take 40 mg by mouth daily., Disp: , Rfl:    traMADol (ULTRAM) 50 MG tablet, Take 1 tablet (50 mg total) by mouth every 6 (six) hours as needed., Disp: 30 tablet, Rfl: 0  Social History: Social History   Tobacco Use   Smoking status: Never   Smokeless tobacco: Never  Vaping Use   Vaping status: Never Used  Substance Use Topics   Alcohol use: Yes    Comment: occassional   Drug use: Never    Family Medical History: No family history on file.  Physical Examination: There were no vitals filed for this visit.  General: Patient is in no apparent  distress. Attention to examination is appropriate.  Neck:   Supple.  Full range of motion.  Respiratory: Patient is breathing without any difficulty.   NEUROLOGICAL:     Awake, alert, oriented to person, place, and time.  Speech is clear and fluent.   Cranial Nerves: Pupils equal round and reactive to light.  Facial tone is symmetric.  Facial sensation is symmetric. Shoulder shrug is symmetric. Tongue protrusion is midline.  There is no pronator drift.  Strength: Side Biceps Triceps Deltoid Interossei Grip Wrist Ext. Wrist Flex.  R 5 5 5 5 5 5 5   L 5 5 5 5 5 5 5    Side Iliopsoas Quads Hamstring PF DF EHL  R 4- 5 5 5 5 5   L 5 5 5 5 5 5    Reflexes are 1+ and symmetric at the biceps, triceps, brachioradialis, patella and achilles.   Hoffman's is absent.   Bilateral upper and lower extremity sensation  is intact to light touch with exception of R L3 distribution hypesthesia.    No evidence of dysmetria noted.  Gait is abnormal -  requires walker.     Medical Decision Making  Imaging: MRI L spine 03/17/2023 IMPRESSION: 1. L3-4: Bulging of the disc more prominent in the right posterolateral direction. Mild narrowing of the right lateral recess, new since 2023. Some potential this could focally irritate the right L4 nerve, though definite nerve compression is not demonstrated. 2. L5-S1: Chronic disc degeneration with loss of disc height and endplate osteophytes. No compressive stenosis. No change since 2023. 3. L1-2: Bulging of the disc in the right posterolateral direction. No compressive stenosis. No change since 2023. 4. L2-3: Minimal annular bulging. No compressive stenosis. No change since 2023.     Electronically Signed   By: Paulina Fusi M.D.   On: 03/31/2023 09:40    I have personally reviewed the images and agree with the above interpretation.  Assessment and Plan: Ms. Ferrando is a pleasant 73 y.o. female with right leg pain that could be the result of right L4  radiculopathy.  She has an EMG that confirms this etiology.  We discussed the possibilities.  She would like to move forward with right-sided L3-4 lateral recess decompression via laminoforaminotomy.  Right L4 radiculopathy Chronic right L4 radiculopathy with burning pain in the right buttock, lateral thigh, and anterior thigh, exacerbated by sitting and walking. MRI indicates potential nerve root compression at L4, correlating with EMG findings. Decision to proceed with decompression surgery due to positive EMG and partial response to injections. Surgical risks include bleeding, infection, and major medical events. Anticipated outcome is a 50% chance of improvement, with potential need for spinal cord stimulator if decompression is ineffective. - Schedule decompression surgery for right L4 nerve root. - Consider spinal cord stimulator evaluation if decompression surgery is ineffective.   I discussed the planned procedure at length with the patient, including the risks, benefits, alternatives, and indications. The risks discussed include but are not limited to bleeding, infection, need for reoperation, spinal fluid leak, stroke, vision loss, anesthetic complication, coma, paralysis, and even death. I also described in detail that improvement was not guaranteed.  The patient expressed understanding of these risks, and asked that we proceed with surgery. I described the surgery in layman's terms, and gave ample opportunity for questions, which were answered to the best of my ability.  I spent a total of 30 minutes in this patient's care today. This time was spent reviewing pertinent records including imaging studies, obtaining and confirming history, performing a directed evaluation, formulating and discussing my recommendations, and documenting the visit within the medical record.   Thank you for involving me in the care of this patient.      Jerine Surles K. Myer Haff MD, [facility] Neurosurgery

## 2023-06-23 ENCOUNTER — Ambulatory Visit: Admitting: Neurosurgery

## 2023-06-23 ENCOUNTER — Other Ambulatory Visit: Payer: Self-pay

## 2023-06-23 VITALS — BP 118/78 | Ht 66.5 in | Wt 134.0 lb

## 2023-06-23 DIAGNOSIS — M5416 Radiculopathy, lumbar region: Secondary | ICD-10-CM

## 2023-06-23 DIAGNOSIS — Z01818 Encounter for other preprocedural examination: Secondary | ICD-10-CM

## 2023-06-23 NOTE — Patient Instructions (Signed)
 Please see below for information in regards to your upcoming surgery:   Planned surgery: Right L3-4 laminoforaminotomy   Surgery date: 07/11/23 at [facility]: [address] [place], [place] [address]) - you will find out your arrival time the business day before your surgery.   Pre-op appointment at [facility] Pre-admit Testing: we will call you with a date/time for this. If you are scheduled for an in person appointment, Pre-admit Testing is located on the first floor of the [facility] building, [address], [address]. Please bring all prescriptions in the original prescription bottles to your appointment. During this appointment, they will advise you which medications you can take the morning of surgery, and which medications you will need to hold for surgery. Labs (such as blood work, EKG) may be done at your pre-op appointment. You are not required to fast for these labs. Should you need to change your pre-op appointment, please call Pre-admit testing at [phone].    Common restrictions after surgery: No bending, lifting, or twisting ("BLT"). Avoid lifting objects heavier than 10 pounds for the first 6 weeks after surgery. Where possible, avoid household activities that involve lifting, bending, reaching, pushing, or pulling such as laundry, vacuuming, grocery shopping, and childcare. Try to arrange for help from friends and family for these activities while you heal. Do not drive while taking prescription pain medication. Weeks 6 through 12 after surgery: avoid lifting more than 25 pounds.     How to contact us:  If you have any questions/concerns before or after surgery, you can reach [place] at [phone], or you can send a mychart message. We can be reached by phone or mychart 8am-4pm, Monday-Friday.  *Please note: Calls after 4pm are forwarded to a third party answering service. Mychart messages are not routinely monitored during  evenings, weekends, and holidays. Please call our office to contact the answering service for urgent concerns during non-business hours.   If you have FMLA/disability paperwork, please drop it off or fax it to [phone], attention Patty.   Appointments/FMLA & disability paperwork: Joycelyn Rua, & Flonnie Hailstone Registered Nurse/Surgery scheduler: Royston Cowper Medical Assistants: Nash Mantis Physician Assistants: Joan Flores, PA-C, Manning Charity, PA-C & Drake Leach, PA-C Surgeons: Venetia Night, MD & Ernestine Mcmurray, MD

## 2023-06-29 ENCOUNTER — Encounter
Admission: RE | Admit: 2023-06-29 | Discharge: 2023-06-29 | Disposition: A | Discharge status: Home / Self Care | Source: Ambulatory Visit | Attending: Neurosurgery | Admitting: Neurosurgery

## 2023-06-29 ENCOUNTER — Other Ambulatory Visit: Payer: Self-pay

## 2023-06-29 VITALS — BP 145/76 | HR 80 | Resp 12 | Ht 66.5 in | Wt 130.0 lb

## 2023-06-29 DIAGNOSIS — E785 Hyperlipidemia, unspecified: Secondary | ICD-10-CM | POA: Diagnosis not present

## 2023-06-29 DIAGNOSIS — Z01818 Encounter for other preprocedural examination: Secondary | ICD-10-CM | POA: Diagnosis present

## 2023-06-29 DIAGNOSIS — Z01812 Encounter for preprocedural laboratory examination: Secondary | ICD-10-CM

## 2023-06-29 DIAGNOSIS — Z01810 Encounter for preprocedural cardiovascular examination: Secondary | ICD-10-CM

## 2023-06-29 HISTORY — DX: Radiculopathy, lumbosacral region: M54.17

## 2023-06-29 HISTORY — DX: Gastro-esophageal reflux disease without esophagitis: K21.9

## 2023-06-29 HISTORY — DX: Other intervertebral disc degeneration, lumbar region without mention of lumbar back pain or lower extremity pain: M51.369

## 2023-06-29 LAB — TYPE AND SCREEN
ABO/RH(D): A POS
Antibody Screen: NEGATIVE

## 2023-06-29 LAB — SURGICAL PCR SCREEN
MRSA, PCR: NEGATIVE
Staphylococcus aureus: NEGATIVE

## 2023-06-29 NOTE — Patient Instructions (Addendum)
 Your procedure is scheduled on:07-11-23 Monday Report to the Registration Desk on the 1st floor of the [facility].Then proceed to the 2nd floor Surgery Desk To find out your arrival time, please call [phone] between 1PM - 3PM on:07-08-23 Friday If your arrival time is 6:00 am, do not arrive before that time as the [facility] entrance doors do not open until 6:00 am.  REMEMBER: Instructions that are not followed completely may result in serious medical risk, up to and including death; or upon the discretion of your surgeon and anesthesiologist your surgery may need to be rescheduled.  Do not eat food after midnight the night before surgery.  No gum chewing or hard candies.  You may however, drink CLEAR liquids up to 2 hours before you are scheduled to arrive for your surgery. Do not drink anything within 2 hours of your scheduled arrival time.  Clear liquids include: - water  - apple juice without pulp - gatorade (not RED colors) - black coffee or tea (Do NOT add milk or creamers to the coffee or tea) Do NOT drink anything that is not on this list.  One week prior to surgery: Stop ANY OVER THE COUNTER supplements until after surgery (Multivitamin, Magnesium)  Continue taking all of your other prescription medications up until the day of surgery.  ON THE DAY OF SURGERY ONLY TAKE THESE MEDICATIONS WITH SIPS OF WATER: -gabapentin (NEURONTIN)   Bring your Albuterol Inhaler to the hospital  No Alcohol for 24 hours before or after surgery.  No Smoking including e-cigarettes for 24 hours before surgery.  No chewable tobacco products for at least 6 hours before surgery.  No nicotine patches on the day of surgery.  Do not use any "recreational" drugs for at least a week (preferably 2 weeks) before your surgery.  Please be advised that the combination of cocaine and anesthesia may have negative outcomes, up to and including death. If you test positive for cocaine, your surgery will  be cancelled.  On the morning of surgery brush your teeth with toothpaste and water, you may rinse your mouth with mouthwash if you wish. Do not swallow any toothpaste or mouthwash.  Use CHG Soap as directed on instruction sheet.  Do not wear jewelry, make-up, hairpins, clips or nail polish.  For welded (permanent) jewelry: bracelets, anklets, waist bands, etc.  Please have this removed prior to surgery.  If it is not removed, there is a chance that hospital personnel will need to cut it off on the day of surgery.  Do not wear lotions, powders, or perfumes.   Do not shave body hair from the neck down 48 hours before surgery.  Contact lenses, hearing aids and dentures may not be worn into surgery.  Do not bring valuables to the hospital. [facility] is not responsible for any missing/lost belongings or valuables.   Notify your doctor if there is any change in your medical condition (cold, fever, infection).  Wear comfortable clothing (specific to your surgery type) to the hospital.  After surgery, you can help prevent lung complications by doing breathing exercises.  Take deep breaths and cough every 1-2 hours. Your doctor may order a device called an Incentive Spirometer to help you take deep breaths. When coughing or sneezing, hold a pillow firmly against your incision with both hands. This is called "splinting." Doing this helps protect your incision. It also decreases belly discomfort.  If you are being admitted to the hospital overnight, leave your suitcase in  the car. After surgery it may be brought to your room.  In case of increased patient census, it may be necessary for you, the patient, to continue your postoperative care in the Same Day Surgery department.  If you are being discharged the day of surgery, you will not be allowed to drive home. You will need a responsible individual to drive you home and stay with you for 24 hours after surgery.   If you are taking public  transportation, you will need to have a responsible individual with you.  Please call the Pre-admissions Testing Dept. at [phone] if you have any questions about these instructions.  Surgery Visitation Policy:  Patients having surgery or a procedure may have two visitors.  Children under the age of 75 must have an adult with them who is not the patient.  Temporary Visitor Restrictions Due to increasing cases of flu, RSV and COVID-19: Children ages 45 and under will not be able to visit patients in [facility] hospitals under most circumstances.    Pre-operative 5 CHG Bath Instructions   You can play a key role in reducing the risk of infection after surgery. Your skin needs to be as free of germs as possible. You can reduce the number of germs on your skin by washing with CHG (chlorhexidine gluconate) soap before surgery. CHG is an antiseptic soap that kills germs and continues to kill germs even after washing.   DO NOT use if you have an allergy to chlorhexidine/CHG or antibacterial soaps. If your skin becomes reddened or irritated, stop using the CHG and notify one of our RNs at [phone].   Please shower with the CHG soap starting 4 days before surgery using the following schedule:     Please keep in mind the following:  DO NOT shave, including legs and underarms, starting the day of your first shower.   You may shave your face at any point before/day of surgery.  Place clean sheets on your bed the day you start using CHG soap. Use a clean washcloth (not used since being washed) for each shower. DO NOT sleep with pets once you start using the CHG.   CHG Shower Instructions:  If you choose to wash your hair and private area, wash first with your normal shampoo/soap.  After you use shampoo/soap, rinse your hair and body thoroughly to remove shampoo/soap residue.  Turn the water OFF and apply about 3 tablespoons (45 ml) of CHG soap to a CLEAN washcloth.  Apply CHG soap  ONLY FROM YOUR NECK DOWN TO YOUR TOES (washing for 3-5 minutes)  DO NOT use CHG soap on face, private areas, open wounds, or sores.  Pay special attention to the area where your surgery is being performed.  If you are having back surgery, having someone wash your back for you may be helpful. Wait 2 minutes after CHG soap is applied, then you may rinse off the CHG soap.  Pat dry with a clean towel  Put on clean clothes/pajamas   If you choose to wear lotion, please use ONLY the CHG-compatible lotions on the back of this paper.     Additional instructions for the day of surgery: DO NOT APPLY any lotions, deodorants, cologne, or perfumes.   Put on clean/comfortable clothes.  Brush your teeth.  Ask your nurse before applying any prescription medications to the skin.      CHG Compatible Lotions   Aveeno Moisturizing lotion  Cetaphil Moisturizing Cream  Cetaphil Moisturizing Lotion  Clairol Herbal Essence Moisturizing Lotion, Dry Skin  Clairol Herbal Essence Moisturizing Lotion, Extra Dry Skin  Clairol Herbal Essence Moisturizing Lotion, Normal Skin  Curel Age Defying Therapeutic Moisturizing Lotion with Alpha Hydroxy  Curel Extreme Care Body Lotion  Curel Soothing Hands Moisturizing Hand Lotion  Curel Therapeutic Moisturizing Cream, Fragrance-Free  Curel Therapeutic Moisturizing Lotion, Fragrance-Free  Curel Therapeutic Moisturizing Lotion, Original Formula  Eucerin Daily Replenishing Lotion  Eucerin Dry Skin Therapy Plus Alpha Hydroxy Crme  Eucerin Dry Skin Therapy Plus Alpha Hydroxy Lotion  Eucerin Original Crme  Eucerin Original Lotion  Eucerin Plus Crme Eucerin Plus Lotion  Eucerin TriLipid Replenishing Lotion  Keri Anti-Bacterial Hand Lotion  Keri Deep Conditioning Original Lotion Dry Skin Formula Softly Scented  Keri Deep Conditioning Original Lotion, Fragrance Free Sensitive Skin Formula  Keri Lotion Fast Absorbing Fragrance Free Sensitive Skin Formula  Keri Lotion  Fast Absorbing Softly Scented Dry Skin Formula  Keri Original Lotion  Keri Skin Renewal Lotion Keri Silky Smooth Lotion  Keri Silky Smooth Sensitive Skin Lotion  Nivea Body Creamy Conditioning Oil  Nivea Body Extra Enriched Teacher, adult education Moisturizing Lotion Nivea Crme  Nivea Skin Firming Lotion  NutraDerm 30 Skin Lotion  NutraDerm Skin Lotion  NutraDerm Therapeutic Skin Cream  NutraDerm Therapeutic Skin Lotion  ProShield Protective Hand Cream  Provon moisturizing lotion

## 2023-07-11 ENCOUNTER — Encounter: Payer: Self-pay | Admitting: Neurosurgery

## 2023-07-11 ENCOUNTER — Ambulatory Visit: Payer: Self-pay | Admitting: Urgent Care

## 2023-07-11 ENCOUNTER — Ambulatory Visit

## 2023-07-11 ENCOUNTER — Other Ambulatory Visit: Payer: Self-pay

## 2023-07-11 ENCOUNTER — Ambulatory Visit: Payer: Self-pay | Admitting: Certified Registered Nurse Anesthetist

## 2023-07-11 ENCOUNTER — Ambulatory Visit
Admission: RE | Admit: 2023-07-11 | Discharge: 2023-07-11 | Disposition: A | Discharge status: Home / Self Care | Attending: Neurosurgery | Admitting: Neurosurgery

## 2023-07-11 ENCOUNTER — Encounter
Admission: RE | Disposition: A | Discharge status: Home / Self Care | Payer: Self-pay | Source: Home / Self Care | Attending: Neurosurgery

## 2023-07-11 DIAGNOSIS — M48061 Spinal stenosis, lumbar region without neurogenic claudication: Secondary | ICD-10-CM | POA: Diagnosis not present

## 2023-07-11 DIAGNOSIS — Z01818 Encounter for other preprocedural examination: Secondary | ICD-10-CM

## 2023-07-11 DIAGNOSIS — J45909 Unspecified asthma, uncomplicated: Secondary | ICD-10-CM | POA: Diagnosis not present

## 2023-07-11 DIAGNOSIS — M5416 Radiculopathy, lumbar region: Secondary | ICD-10-CM | POA: Insufficient documentation

## 2023-07-11 DIAGNOSIS — G8929 Other chronic pain: Secondary | ICD-10-CM | POA: Diagnosis not present

## 2023-07-11 DIAGNOSIS — K219 Gastro-esophageal reflux disease without esophagitis: Secondary | ICD-10-CM | POA: Insufficient documentation

## 2023-07-11 DIAGNOSIS — Z981 Arthrodesis status: Secondary | ICD-10-CM | POA: Insufficient documentation

## 2023-07-11 HISTORY — PX: FORAMINOTOMY 1 LEVEL: SHX5835

## 2023-07-11 SURGERY — FORAMINOTOMY 1 LEVEL
Anesthesia: General | Site: Spine Lumbar | Laterality: Right

## 2023-07-11 MED ORDER — TIZANIDINE HCL 2 MG PO TABS
2.0000 mg | ORAL_TABLET | Freq: Four times a day (QID) | ORAL | 0 refills | Status: DC | PRN
Start: 2023-07-11 — End: 2023-07-27
  Filled 2023-07-11: qty 90, 23d supply, fill #0

## 2023-07-11 MED ORDER — SODIUM CHLORIDE (PF) 0.9 % IJ SOLN
INTRAMUSCULAR | Status: DC | PRN
  Administered 2023-07-11: 60 mL via INTRAMUSCULAR

## 2023-07-11 MED ORDER — BUPIVACAINE LIPOSOME 1.3 % IJ SUSP
INTRAMUSCULAR | Status: AC
  Filled 2023-07-11: qty 20

## 2023-07-11 MED ORDER — REMIFENTANIL HCL 1 MG IV SOLR
INTRAVENOUS | Status: AC
  Filled 2023-07-11: qty 1000

## 2023-07-11 MED ORDER — DEXAMETHASONE SODIUM PHOSPHATE 10 MG/ML IJ SOLN
INTRAMUSCULAR | Status: DC | PRN
  Administered 2023-07-11: 10 mg via INTRAVENOUS

## 2023-07-11 MED ORDER — FENTANYL CITRATE (PF) 100 MCG/2ML IJ SOLN
25.0000 ug | INTRAMUSCULAR | Status: DC | PRN
  Administered 2023-07-11: 25 ug via INTRAVENOUS

## 2023-07-11 MED ORDER — FENTANYL CITRATE (PF) 100 MCG/2ML IJ SOLN
INTRAMUSCULAR | Status: AC
  Filled 2023-07-11: qty 2

## 2023-07-11 MED ORDER — LACTATED RINGERS IV SOLN: INTRAVENOUS | Status: DC

## 2023-07-11 MED ORDER — CHLORHEXIDINE GLUCONATE 0.12 % MT SOLN: 15.0000 mL | Freq: Once | OROMUCOSAL | Status: DC

## 2023-07-11 MED ORDER — OXYCODONE HCL 5 MG PO TABS
ORAL_TABLET | ORAL | Status: AC
  Filled 2023-07-11: qty 1

## 2023-07-11 MED ORDER — OXYCODONE HCL 5 MG/5ML PO SOLN: 5.0000 mg | Freq: Once | ORAL | Status: AC | PRN

## 2023-07-11 MED ORDER — ACETAMINOPHEN 10 MG/ML IV SOLN
INTRAVENOUS | Status: DC | PRN
  Administered 2023-07-11: 1000 mg via INTRAVENOUS

## 2023-07-11 MED ORDER — OXYCODONE HCL 5 MG PO TABS
5.0000 mg | ORAL_TABLET | ORAL | 0 refills | Status: AC | PRN
Start: 2023-07-11 — End: 2023-07-16
  Filled 2023-07-11: qty 30, 5d supply, fill #0

## 2023-07-11 MED ORDER — PHENYLEPHRINE 80 MCG/ML (10ML) SYRINGE FOR IV PUSH (FOR BLOOD PRESSURE SUPPORT)
PREFILLED_SYRINGE | INTRAVENOUS | Status: DC | PRN
  Administered 2023-07-11: 160 ug via INTRAVENOUS

## 2023-07-11 MED ORDER — METHYLPREDNISOLONE ACETATE 40 MG/ML IJ SUSP
INTRAMUSCULAR | Status: DC | PRN
  Administered 2023-07-11: 40 mg

## 2023-07-11 MED ORDER — EPHEDRINE SULFATE-NACL 50-0.9 MG/10ML-% IV SOSY
PREFILLED_SYRINGE | INTRAVENOUS | Status: DC | PRN
  Administered 2023-07-11: 10 mg via INTRAVENOUS

## 2023-07-11 MED ORDER — CEFAZOLIN SODIUM-DEXTROSE 2-4 GM/100ML-% IV SOLN
INTRAVENOUS | Status: AC
  Filled 2023-07-11: qty 100

## 2023-07-11 MED ORDER — CHLORHEXIDINE GLUCONATE 0.12 % MT SOLN
OROMUCOSAL | Status: AC
  Filled 2023-07-11: qty 15

## 2023-07-11 MED ORDER — FENTANYL CITRATE (PF) 100 MCG/2ML IJ SOLN
INTRAMUSCULAR | Status: DC | PRN
  Administered 2023-07-11: 50 ug via INTRAVENOUS

## 2023-07-11 MED ORDER — ORAL CARE MOUTH RINSE: 15.0000 mL | Freq: Once | OROMUCOSAL | Status: DC

## 2023-07-11 MED ORDER — BUPIVACAINE HCL (PF) 0.5 % IJ SOLN
INTRAMUSCULAR | Status: AC
  Filled 2023-07-11: qty 30

## 2023-07-11 MED ORDER — ACETAMINOPHEN 10 MG/ML IV SOLN: 1000.0000 mg | Freq: Once | INTRAVENOUS | Status: DC | PRN

## 2023-07-11 MED ORDER — LACTATED RINGERS IV SOLN
INTRAVENOUS | Status: DC
Start: 2023-07-11 — End: 2023-07-11

## 2023-07-11 MED ORDER — ONDANSETRON HCL 4 MG/2ML IJ SOLN: 4.0000 mg | Freq: Once | INTRAMUSCULAR | Status: DC | PRN

## 2023-07-11 MED ORDER — CEFAZOLIN IN SODIUM CHLORIDE 2-0.9 GM/100ML-% IV SOLN
2.0000 g | Freq: Once | INTRAVENOUS | Status: AC
  Administered 2023-07-11: 2 g via INTRAVENOUS
  Filled 2023-07-11: qty 100

## 2023-07-11 MED ORDER — SURGIFLO WITH THROMBIN (HEMOSTATIC MATRIX KIT) OPTIME
TOPICAL | Status: DC | PRN
  Administered 2023-07-11: 1 via TOPICAL

## 2023-07-11 MED ORDER — ONDANSETRON HCL 4 MG/2ML IJ SOLN
INTRAMUSCULAR | Status: DC | PRN
  Administered 2023-07-11 (×2): 4 mg via INTRAVENOUS

## 2023-07-11 MED ORDER — SENNA 8.6 MG PO TABS
1.0000 | ORAL_TABLET | Freq: Two times a day (BID) | ORAL | 0 refills | Status: DC | PRN
  Filled 2023-07-11: qty 30, 15d supply, fill #0

## 2023-07-11 MED ORDER — SODIUM CHLORIDE (PF) 0.9 % IJ SOLN
INTRAMUSCULAR | Status: AC
  Filled 2023-07-11: qty 20

## 2023-07-11 MED ORDER — METHYLPREDNISOLONE ACETATE 40 MG/ML IJ SUSP
INTRAMUSCULAR | Status: AC
  Filled 2023-07-11: qty 1

## 2023-07-11 MED ORDER — PHENYLEPHRINE HCL-NACL 20-0.9 MG/250ML-% IV SOLN
INTRAVENOUS | Status: DC | PRN
  Administered 2023-07-11: 25 ug/min via INTRAVENOUS

## 2023-07-11 MED ORDER — BUPIVACAINE-EPINEPHRINE (PF) 0.5% -1:200000 IJ SOLN
INTRAMUSCULAR | Status: DC | PRN
  Administered 2023-07-11: 3 mL via PERINEURAL

## 2023-07-11 MED ORDER — LIDOCAINE HCL (CARDIAC) PF 100 MG/5ML IV SOSY
PREFILLED_SYRINGE | INTRAVENOUS | Status: DC | PRN
  Administered 2023-07-11: 100 mg via INTRAVENOUS

## 2023-07-11 MED ORDER — PROPOFOL 10 MG/ML IV BOLUS
INTRAVENOUS | Status: DC | PRN
  Administered 2023-07-11: 150 mg via INTRAVENOUS

## 2023-07-11 MED ORDER — GLYCOPYRROLATE 0.2 MG/ML IJ SOLN
INTRAMUSCULAR | Status: DC | PRN
Start: 2023-07-11 — End: 2023-07-11
  Administered 2023-07-11: .2 mg via INTRAVENOUS

## 2023-07-11 MED ORDER — OXYCODONE HCL 5 MG PO TABS
5.0000 mg | ORAL_TABLET | Freq: Once | ORAL | Status: AC | PRN
  Administered 2023-07-11: 5 mg via ORAL

## 2023-07-11 MED ORDER — ACETAMINOPHEN 10 MG/ML IV SOLN
INTRAVENOUS | Status: AC
  Filled 2023-07-11: qty 100

## 2023-07-11 MED ORDER — 0.9 % SODIUM CHLORIDE (POUR BTL) OPTIME
TOPICAL | Status: DC | PRN
  Administered 2023-07-11: 500 mL

## 2023-07-11 MED ORDER — SUCCINYLCHOLINE CHLORIDE 200 MG/10ML IV SOSY
PREFILLED_SYRINGE | INTRAVENOUS | Status: DC | PRN
  Administered 2023-07-11: 80 mg via INTRAVENOUS

## 2023-07-11 MED ORDER — PROPOFOL 1000 MG/100ML IV EMUL
INTRAVENOUS | Status: AC
  Filled 2023-07-11: qty 100

## 2023-07-11 SURGICAL SUPPLY — 34 items
BASIN KIT SINGLE STR (MISCELLANEOUS) ×1 IMPLANT
BUR NEURO DRILL SOFT 3.0X3.8M (BURR) ×1 IMPLANT
DERMABOND ADVANCED .7 DNX12 (GAUZE/BANDAGES/DRESSINGS) ×1 IMPLANT
DRAPE C ARM PK CFD 31 SPINE (DRAPES) ×1 IMPLANT
DRAPE LAPAROTOMY 100X77 ABD (DRAPES) ×1 IMPLANT
DRAPE MICROSCOPE SPINE 48X150 (DRAPES) ×1 IMPLANT
DRSG OPSITE POSTOP 3X4 (GAUZE/BANDAGES/DRESSINGS) ×1 IMPLANT
DRSG TEGADERM 6X8 (GAUZE/BANDAGES/DRESSINGS) IMPLANT
ELECT EZSTD 165MM 6.5IN (MISCELLANEOUS) ×1 IMPLANT
ELECT REM PT RETURN 9FT ADLT (ELECTROSURGICAL) ×1 IMPLANT
ELECTRODE EZSTD 165MM 6.5IN (MISCELLANEOUS) ×1 IMPLANT
ELECTRODE REM PT RTRN 9FT ADLT (ELECTROSURGICAL) ×1 IMPLANT
GLOVE BIOGEL PI IND STRL 6.5 (GLOVE) ×1 IMPLANT
GLOVE SURG SYN 6.5 ES PF (GLOVE) ×1 IMPLANT
GLOVE SURG SYN 6.5 PF PI (GLOVE) ×1 IMPLANT
GLOVE SURG SYN 8.5 E (GLOVE) ×3 IMPLANT
GLOVE SURG SYN 8.5 PF PI (GLOVE) ×3 IMPLANT
GOWN SRG LRG LVL 4 IMPRV REINF (GOWNS) ×1 IMPLANT
GOWN SRG XL LVL 3 NONREINFORCE (GOWNS) ×1 IMPLANT
KIT SPINAL PRONEVIEW (KITS) ×1 IMPLANT
MANIFOLD NEPTUNE II (INSTRUMENTS) ×1 IMPLANT
MARKER SKIN DUAL TIP RULER LAB (MISCELLANEOUS) ×1 IMPLANT
NDL SAFETY ECLIPSE 18X1.5 (NEEDLE) ×1 IMPLANT
NS IRRIG 500ML POUR BTL (IV SOLUTION) ×1 IMPLANT
PACK LAMINECTOMY ARMC (PACKS) ×1 IMPLANT
PAD ARMBOARD POSITIONER FOAM (MISCELLANEOUS) ×1 IMPLANT
SURGIFLO W/THROMBIN 8M KIT (HEMOSTASIS) ×1 IMPLANT
SUT STRATA 3-0 15 PS-2 (SUTURE) ×1 IMPLANT
SUT VIC AB 0 CT1 27XCR 8 STRN (SUTURE) ×1 IMPLANT
SUT VIC AB 2-0 CT1 18 (SUTURE) ×1 IMPLANT
SYR 30ML LL (SYRINGE) ×2 IMPLANT
SYR 3ML LL SCALE MARK (SYRINGE) ×1 IMPLANT
TRAP FLUID SMOKE EVACUATOR (MISCELLANEOUS) ×1 IMPLANT
WATER STERILE IRR 500ML POUR (IV SOLUTION) IMPLANT

## 2023-07-11 NOTE — Anesthesia Preprocedure Evaluation (Addendum)
 Anesthesia Evaluation  Patient identified by MRN, date of birth, ID band Patient awake    Reviewed: Allergy & Precautions, NPO status , Patient's Chart, lab work & pertinent test results  History of Anesthesia Complications (+) PROLONGED EMERGENCE and history of anesthetic complications  Airway Mallampati: I   Neck ROM: Full    Dental no notable dental hx.    Pulmonary asthma    Pulmonary exam normal breath sounds clear to auscultation       Cardiovascular Exercise Tolerance: Good Normal cardiovascular exam Rhythm:Regular Rate:Normal  ECG 06/29/23: normal   Neuro/Psych  PSYCHIATRIC DISORDERS Anxiety     negative neurological ROS     GI/Hepatic ,GERD  ,,  Endo/Other  negative endocrine ROS    Renal/GU negative Renal ROS     Musculoskeletal   Abdominal   Peds  Hematology  (+) Blood dyscrasia, anemia   Anesthesia Other Findings   Reproductive/Obstetrics                             Anesthesia Physical Anesthesia Plan  ASA: 2  Anesthesia Plan: General   Post-op Pain Management:    Induction: Intravenous  PONV Risk Score and Plan: 3 and Ondansetron, Dexamethasone and Treatment may vary due to age or medical condition  Airway Management Planned: Oral ETT  Additional Equipment:   Intra-op Plan:   Post-operative Plan: Extubation in OR  Informed Consent: I have reviewed the patients History and Physical, chart, labs and discussed the procedure including the risks, benefits and alternatives for the proposed anesthesia with the patient or authorized representative who has indicated his/her understanding and acceptance.     Dental advisory given  Plan Discussed with: CRNA  Anesthesia Plan Comments: (Patient consented for risks of anesthesia including but not limited to:  - adverse reactions to medications - damage to eyes, teeth, lips or other oral mucosa - nerve damage due to  positioning  - sore throat or hoarseness - damage to heart, brain, nerves, lungs, other parts of body or loss of life  Informed patient about role of CRNA in peri- and intra-operative care.  Patient voiced understanding.)        Anesthesia Quick Evaluation

## 2023-07-11 NOTE — Transfer of Care (Signed)
 Immediate Anesthesia Transfer of Care Note  Patient: Cindy Barber  Procedure(s) Performed: FORAMINOTOMY 1 LEVEL (Right: Spine Lumbar)  Patient Location: PACU  Anesthesia Type:General  Level of Consciousness: awake, drowsy, and patient cooperative  Airway & Oxygen Therapy: Patient Spontanous Breathing and Patient connected to face mask oxygen  Post-op Assessment: Report given to RN and Post -op Vital signs reviewed and stable  Post vital signs: Reviewed and stable  Last Vitals:  Vitals Value Taken Time  BP 133/90 07/11/23 1019  Temp 36.1 C 07/11/23 1019  Pulse 89 07/11/23 1022  Resp 17 07/11/23 1022  SpO2 100 % 07/11/23 1022  Vitals shown include unfiled device data.  Last Pain:  Vitals:   07/11/23 0819  TempSrc: Temporal  PainSc: 3          Complications: No notable events documented.

## 2023-07-11 NOTE — Discharge Instructions (Addendum)
 Your surgeon has performed an operation on your lumbar spine (low back) to relieve pressure on one or more nerves. Many times, patients feel better immediately after surgery and can "overdo it." Even if you feel well, it is important that you follow these activity guidelines. If you do not let your back heal properly from the surgery, you can increase the chance of a disc herniation and/or return of your symptoms. The following are instructions to help in your recovery once you have been discharged from the hospital.  * It is ok to take NSAIDs after surgery.  Activity    No bending, lifting, or twisting ("BLT"). Avoid lifting objects heavier than 10 pounds (gallon milk jug).  Where possible, avoid household activities that involve lifting, bending, pushing, or pulling such as laundry, vacuuming, grocery shopping, and childcare. Try to arrange for help from friends and family for these activities while your back heals.  Increase physical activity slowly as tolerated.  Taking short walks is encouraged, but avoid strenuous exercise. Do not jog, run, bicycle, lift weights, or participate in any other exercises unless specifically allowed by your doctor. Avoid prolonged sitting, including car rides.  Talk to your doctor before resuming sexual activity.  You should not drive until cleared by your doctor.  Until released by your doctor, you should not return to work or school.  You should rest at home and let your body heal.   You may shower three days after your surgery.  After showering, lightly dab your incision dry. Do not take a tub bath or go swimming for 3 weeks, or until approved by your doctor at your follow-up appointment.  If you smoke, we strongly recommend that you quit.  Smoking has been proven to interfere with normal healing in your back and will dramatically reduce the success rate of your surgery. Please contact [facility] (800-QUIT-NOW) and use the resources at www.[facility].com for  assistance in stopping smoking.  Surgical Incision   If you have a dressing on your incision, you may remove it three days after your surgery. Keep your incision area clean and dry.  If you have staples or stitches on your incision, you should have a follow up scheduled for removal. If you do not have staples or stitches, you will have steri-strips (small pieces of surgical tape) or Dermabond glue. The steri-strips/glue should begin to peel away within about a week (it is fine if the steri-strips fall off before then). If the strips are still in place one week after your surgery, you may gently remove them.  Diet            You may return to your usual diet. Be sure to stay hydrated.  When to Contact us  Although your surgery and recovery will likely be uneventful, you may have some residual numbness, aches, and pains in your back and/or legs. This is normal and should improve in the next few weeks.  However, should you experience any of the following, contact us immediately: New numbness or weakness Pain that is progressively getting worse, and is not relieved by your pain medications or rest Bleeding, redness, swelling, pain, or drainage from surgical incision Chills or flu-like symptoms Fever greater than 101.0 F (38.3 C) Problems with bowel or bladder functions Difficulty breathing or shortness of breath Warmth, tenderness, or swelling in your calf  Contact Information How to contact us:  If you have any questions/concerns before or after surgery, you can reach [place] at [phone], or you can  send a [employer]. We can be reached by phone or mychart 8am-4pm, Monday-Friday.  *Please note: Calls after 4pm are forwarded to a third party answering service. Mychart messages are not routinely monitored during evenings, weekends, and holidays. Please call our office to contact the answering service for urgent concerns during non-business hours.

## 2023-07-11 NOTE — Anesthesia Postprocedure Evaluation (Signed)
 Anesthesia Post Note  Patient: Cindy Barber  Procedure(s) Performed: FORAMINOTOMY 1 LEVEL (Right: Spine Lumbar)  Patient location during evaluation: PACU Anesthesia Type: General Level of consciousness: awake and alert, oriented and patient cooperative Pain management: pain level controlled Vital Signs Assessment: post-procedure vital signs reviewed and stable Respiratory status: spontaneous breathing, nonlabored ventilation and respiratory function stable Cardiovascular status: blood pressure returned to baseline and stable Postop Assessment: adequate PO intake Anesthetic complications: no   No notable events documented.   Last Vitals:  Vitals:   07/11/23 1108 07/11/23 1119  BP:  123/80  Pulse: 85 91  Resp: 11 14  Temp: (!) 36.1 C 36.6 C  SpO2: 98% 97%    Last Pain:  Vitals:   07/11/23 1119  TempSrc: Temporal  PainSc: 4                  Reed Breech

## 2023-07-11 NOTE — Op Note (Signed)
 Indications: [name] is suffering from lumbar radiculopathy. The patient tried and failed conservative management, prompting surgical intervention.  Findings: lateral recess stenosis  Preoperative Diagnosis: Lumbar radiculopathy Postoperative Diagnosis: same   EBL: 10 ml IVF:see anesthesia record Drains: none Disposition: Extubated and Stable to PACU Complications: none  No foley catheter was placed.   Preoperative Note:  Risks of surgery discussed include: infection, bleeding, stroke, coma, death, paralysis, CSF leak, nerve/spinal cord injury, numbness, tingling, weakness, complex regional pain syndrome, recurrent stenosis and/or disc herniation, vascular injury, development of instability, neck/back pain, need for further surgery, persistent symptoms, development of deformity, and the risks of anesthesia. The patient understood these risks and agreed to proceed.  Operative Note:   1. Right L3-4 lumbar decompression (laminoforaminotomy)  The patient was then brought from the preoperative center with intravenous access established.  The patient underwent general anesthesia and endotracheal tube intubation, and was then rotated on the [place] rail top where all pressure points were appropriately padded.  The skin was then thoroughly cleansed.  Perioperative antibiotic prophylaxis was administered.  Sterile prep and drapes were then applied and a timeout was then observed.  C-arm was brought into the field under sterile conditions and under lateral visualization the L3-4 interspace was identified and marked.  The incision was marked on the right and injected with local anesthetic. Once this was complete a 2 cm incision was opened with the use of a #10 blade knife.    The metrx tubes were sequentially advanced and confirmed in position at L3-4. An 18mm by 40mm tube was locked in place to the bed side attachment.  The microscope was then sterilely brought into the field and muscle  creep was hemostased with a bipolar and resected with a pituitary rongeur.  A Bovie extender was then used to expose the spinous process and lamina.  Careful attention was placed to not violate the facet capsule. A 3 mm matchstick drill bit was then used to make a hemi-laminotomy trough until the ligamentum flavum was exposed.  This was extended to the base of the spinous process and to the contralateral side to remove all the central bone from each side.  Once this was complete and the underlying ligamentum flavum was visualized, it was dissected with a curette and resected with Kerrison rongeurs.  Extensive ligamentum hypertrophy was noted, requiring a substantial amount of time and care for removal.  The dura was identified and palpated. The kerrison rongeur was then used to remove the medial facet bilaterally until no compression was noted.  A balltip probe was used to confirm decompression of the ipsilateral L4 nerve root.  No CSF leak was noted.  Depo-Medrol was placed on the nerve root.  The wound was copiously irrigated. The tube system was then removed under microscopic visualization and hemostasis was obtained with a bipolar.    The fascial layer was reapproximated with the use of a 0 Vicryl suture.  Subcutaneous tissue layer was reapproximated using 2-0 Vicryl suture.  3-0 monocryl was placed in subcuticular fashion. The skin was then cleansed and Dermabond was used to close the skin opening.  Patient was then rotated back to the preoperative bed awakened from anesthesia and taken to recovery all counts are correct in this case.  I performed the entire procedure with the assistance of Manning Charity PA as an Designer, television/film set. An assistant was required for this procedure due to the complexity.  The assistant provided assistance in tissue manipulation and suction, and was required  for the successful and safe performance of the procedure. I performed the critical portions of the  procedure.   Kristain Filo K. Myer Haff MD

## 2023-07-11 NOTE — Interval H&P Note (Signed)
 History and Physical Interval Note:  07/11/2023 8:34 AM  Cindy Barber  has presented today for surgery, with the diagnosis of lumbar radiculopathy.  The various methods of treatment have been discussed with the patient and family. After consideration of risks, benefits and other options for treatment, the patient has consented to  Procedure(s) with comments: FORAMINOTOMY 1 LEVEL (Right) - RIGHT L3-4 LAMINOFORAMINOTOMY as a surgical intervention.  The patient's history has been reviewed, patient examined, no change in status, stable for surgery.  I have reviewed the patient's chart and labs.  Questions were answered to the patient's satisfaction.    Heart sounds normal no MRG. Chest Clear to Auscultation Bilaterally.   Carvel Huskins

## 2023-07-11 NOTE — Discharge Summary (Signed)
 Discharge Summary  Patient ID: Cindy Barber MRN: [id number] DOB/AGE: 11-Jun-1950 73 y.o.  Admit date: 07/11/2023 Discharge date: 07/11/2023  Admission Diagnoses: Lumbar radiculopathy  Discharge Diagnoses:  Active Problems:   * No active hospital problems. *   Discharged Condition: good  Hospital Course:  Cindy Barber is a 73 y.o presenting with lumbar radiculopathy s/p right L3-4 lumbar decompression. Her intraoperative course was uncomplicated. She was monitored in PACU and discharged home after ambulating, urinating, and tolerating PO intake. She was given prescriptions for pain medication, muscle relaxer, and stool softener to take as needed.  Consults: None  Significant Diagnostic Studies: NA  Treatments: surgery: As above.  Please see separately dictated operative report for further details  Discharge Exam: Blood pressure 116/81, pulse 79, temperature 98 F (36.7 C), temperature source Temporal, resp. rate 16, height 5' 6.5" (1.689 m), weight 59 kg, SpO2 99%. CN grossly intact 5/5 throughout BLE Incision c/d/I with clean postoperative dressing in place  Disposition: Discharge disposition: 01-Home or Self Care        Allergies as of 07/11/2023       Reactions   Morphine Anaphylaxis   Gluten Meal Diarrhea, Nausea Only   Abdominal pain   Naproxen Diarrhea   Adderall [amphetamine-dextroamphetamine] Anxiety   paranoid   Ativan [lorazepam] Anxiety        Medication List     TAKE these medications    albuterol 108 (90 Base) MCG/ACT inhaler Commonly known as: VENTOLIN HFA Inhale 1-2 puffs into the lungs every 6 (six) hours as needed for wheezing or shortness of breath (Pt has NOT used recently).   atorvastatin 10 MG tablet Commonly known as: LIPITOR Take 10 mg by mouth at bedtime.   gabapentin 300 MG capsule Commonly known as: NEURONTIN Take 600 mg by mouth 3 (three) times daily.   magnesium oxide 400 (240 Mg) MG tablet Commonly known as: MAG-OX Take  400 mg by mouth 2 (two) times daily.   meloxicam 7.5 MG tablet Commonly known as: MOBIC Take 7.5 mg by mouth daily.   MULTIPLE VITAMIN PO Take 1 tablet by mouth 2 (two) times daily.   oxyCODONE 5 MG immediate release tablet Commonly known as: Roxicodone Take 1 tablet (5 mg total) by mouth every 4 (four) hours as needed for up to 5 days.   senna 8.6 MG Tabs tablet Commonly known as: SENOKOT Take 1 tablet (8.6 mg total) by mouth 2 (two) times daily as needed.   tiZANidine 2 MG tablet Commonly known as: ZANAFLEX Take 1 tablet (2 mg total) by mouth every 6 (six) hours as needed for muscle spasms.        Follow-up Information     Joan Flores, PA-C Follow up on 07/27/2023.   Specialty: Physician Assistant Contact information: [address] [place], [place] [address] [place] [address] [phone]                 Signed: Susanne Borders 07/11/2023, 10:18 AM

## 2023-07-11 NOTE — Anesthesia Procedure Notes (Signed)
 Procedure Name: Intubation Date/Time: 07/11/2023 9:07 AM  Performed by: Mohammed Kindle, CRNAPre-anesthesia Checklist: Patient identified, Emergency Drugs available, Suction available and Patient being monitored Patient Re-evaluated:Patient Re-evaluated prior to induction Oxygen Delivery Method: Circle system utilized Preoxygenation: Pre-oxygenation with 100% oxygen Induction Type: IV induction Ventilation: Mask ventilation without difficulty Laryngoscope Size: 3 Grade View: Grade I Tube type: Oral Tube size: 6.5 mm Number of attempts: 1 Airway Equipment and Method: Stylet Placement Confirmation: ETT inserted through vocal cords under direct vision, positive ETCO2 and breath sounds checked- equal and bilateral Secured at: 21 cm Tube secured with: Tape Dental Injury: Teeth and Oropharynx as per pre-operative assessment

## 2023-07-12 ENCOUNTER — Encounter: Payer: Self-pay | Admitting: Neurosurgery

## 2023-07-27 ENCOUNTER — Ambulatory Visit (INDEPENDENT_AMBULATORY_CARE_PROVIDER_SITE_OTHER): Admitting: Physician Assistant

## 2023-07-27 VITALS — BP 126/78 | Temp 97.5°F | Ht 66.5 in | Wt 130.0 lb

## 2023-07-27 DIAGNOSIS — Z09 Encounter for follow-up examination after completed treatment for conditions other than malignant neoplasm: Secondary | ICD-10-CM

## 2023-07-27 DIAGNOSIS — Z96641 Presence of right artificial hip joint: Secondary | ICD-10-CM

## 2023-07-27 DIAGNOSIS — M5416 Radiculopathy, lumbar region: Secondary | ICD-10-CM

## 2023-07-27 NOTE — Progress Notes (Unsigned)
   REFERRING PHYSICIAN:  Salina Craver, Md No address on file  DOS: 07/11/23, R L3-4 decompression  HISTORY OF PRESENT ILLNESS: Cindy Barber is approximately 2 weeks status post right L3-4 decompression. she is doing overall well.  She is having some discomfort but the burning pain in her right leg is gone.  It increases with prolonged sitting or standing, but is greatly decreased compared to baseline.  She feels as though her ambulating has improved however she has a big fear of falling and has been using a rolling walker since her hip surgery in 2023.  She is not currently doing any physical therapy or exercises.  No increased weakness, numbness or tingling.    PHYSICAL EXAMINATION:  General: Patient is well developed, well nourished, calm, collected, and in no apparent distress.   NEUROLOGICAL:  General: In no acute distress.   Awake, alert, oriented to person, place, and time.  Pupils equal round and reactive to light.    Strength:            Side Iliopsoas Quads Hamstring PF DF EHL  R 4- 5 5 5 5 5   L 5 5 5 5 5 5    Incision c/d/i   ROS (Neurologic):  Negative except as noted above  IMAGING: No new imaging completed prior to her appointment today.  ASSESSMENT/PLAN:  Cindy Barber is approximately 2 weeks status post right L3-4 decompression. she is doing overall well.  She is having some discomfort but the burning pain in her right leg is gone.  It increases with prolonged sitting or standing, but is greatly decreased compared to baseline.  She feels as though her ambulating has improved however she has a big fear of falling and has been using a rolling walker since her hip surgery in 2023.  She is not currently doing any physical therapy or exercises.  No increased weakness, numbness or tingling.  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  After 12 weeks post-op, the patient advised to increase activity as tolerated.  She would like to do physical therapy.  We did discuss this with plans to start around 6 weeks postop at [facility] and an external referral has been placed for this to focus on gait training specifically and strengthening.  Advised to contact the office if any questions or concerns arise.  Ludwig Safer PA-C Department of neurosurgery

## 2023-08-23 ENCOUNTER — Ambulatory Visit (INDEPENDENT_AMBULATORY_CARE_PROVIDER_SITE_OTHER): Admitting: Neurosurgery

## 2023-08-23 VITALS — BP 128/78 | Temp 97.6°F | Ht 66.5 in | Wt 130.0 lb

## 2023-08-23 DIAGNOSIS — Z09 Encounter for follow-up examination after completed treatment for conditions other than malignant neoplasm: Secondary | ICD-10-CM

## 2023-08-23 DIAGNOSIS — M5416 Radiculopathy, lumbar region: Secondary | ICD-10-CM

## 2023-08-23 NOTE — Progress Notes (Signed)
   REFERRING PHYSICIAN:  Salina Craver, Md No address on file  DOS: 07/11/23, R L3-4 decompression  HISTORY OF PRESENT ILLNESS: Cindy Barber is status post right L3-4 decompression. she is doing overall well.   Her severe leg pain is gone.  She still having some discomfort around her right hip.  She is using a rolling walker but is starting with physical therapy next week.    PHYSICAL EXAMINATION:  General: Patient is well developed, well nourished, calm, collected, and in no apparent distress.   NEUROLOGICAL:  General: In no acute distress.   Awake, alert, oriented to person, place, and time.  Pupils equal round and reactive to light.    Strength:            Side Iliopsoas Quads Hamstring PF DF EHL  R 4 5 5 5 5 5   L 5 5 5 5 5 5    Incision c/d/i   ROS (Neurologic):  Negative except as noted above  IMAGING: No new imaging completed prior to her appointment today.  ASSESSMENT/PLAN:  Cindy Barber is status post right L3-4 decompression. she is doing overall well.  I expect that her pain will continue to improve.  It could be multifactorial given her hip replacement on the same side, but I am very pleased with her improvements.  She will start physical therapy soon.  We reviewed her activity limitations.  Will see her back in clinic in 6 weeks.    Jodeen Munch MD Department of neurosurgery

## 2023-09-08 ENCOUNTER — Other Ambulatory Visit: Payer: Self-pay

## 2023-09-08 ENCOUNTER — Other Ambulatory Visit (HOSPITAL_COMMUNITY): Payer: Self-pay

## 2023-09-29 ENCOUNTER — Encounter: Admitting: Neurosurgery

## 2023-10-04 ENCOUNTER — Other Ambulatory Visit: Payer: Self-pay

## 2023-10-04 ENCOUNTER — Other Ambulatory Visit (HOSPITAL_COMMUNITY): Payer: Self-pay

## 2023-10-06 ENCOUNTER — Ambulatory Visit: Admitting: Neurosurgery

## 2023-10-06 ENCOUNTER — Encounter: Payer: Self-pay | Admitting: Neurosurgery

## 2023-10-06 VITALS — BP 110/76 | Ht 66.5 in | Wt 129.4 lb

## 2023-10-06 DIAGNOSIS — M5416 Radiculopathy, lumbar region: Secondary | ICD-10-CM

## 2023-10-06 DIAGNOSIS — Z09 Encounter for follow-up examination after completed treatment for conditions other than malignant neoplasm: Secondary | ICD-10-CM

## 2023-10-06 MED ORDER — METHYLPREDNISOLONE 4 MG PO TBPK: ORAL_TABLET | ORAL | 0 refills | Status: DC

## 2023-10-06 NOTE — Progress Notes (Signed)
   REFERRING PHYSICIAN:  Nicolas Bouchard, Md No address on file  DOS: 07/11/23, R L3-4 decompression  HISTORY OF PRESENT ILLNESS:  10/06/23 Cindy Barber presents today for 6 week follow up after lumbar decompression. Unfortunately in the interim she has had an acute onset of right anterior thigh pain and pain in her low back that started after initiating PT and worsened over the worse of a few weeks. She was seen by Dr. Lorelle who recommended a GTB injection and updated MRI of her right hip but also suggested follow up with us  given her anterior thigh pain and hyperesthesias. She reports symptoms similar to her pre-op pain. She is currently taking Meloxicam and Tylenol  which are providing minimal relief.   08/23/23 Cindy Barber is status post right L3-4 decompression. she is doing overall well.   Her severe leg pain is gone.  She still having some discomfort around her right hip.  She is using a rolling walker but is starting with physical therapy next week.    PHYSICAL EXAMINATION:  General: Patient is well developed, well nourished, calm, collected, and in no apparent distress.   NEUROLOGICAL:  General: In no acute distress.   Awake, alert, oriented to person, place, and time.  Pupils equal round and reactive to light.    Strength:            Side Iliopsoas Quads Hamstring PF DF EHL  R 4 5 5 5 5 5   L 5 5 5 5 5 5    Incision well healed    ROS (Neurologic):  Negative except as noted above  IMAGING: No new imaging completed prior to her appointment today.  ASSESSMENT/PLAN:  Cindy Barber is status post right L3-4 decompression. Unfortunately she has had a pain exacerbating which has not improved with time and was worsened by therapy. I reached out to Dr. Lorelle as she has still not heard back in regards to her MRI. I also sent a message to Dr. Kubinski to see if he can do her injection sooner. While I admit that some of her symptoms could be related to her hip, her anterior thigh  pain and dysesthesias sound more like lumbar radiculopathy. Because of this, I recommend an updated lumbar MRI for further evaluation. In the meantime, I recommended a MDP. She was instructed to hold her Mobic while on steroids. I also recommended that she restart Gabapentin as this has been helpful for her in the past. She states she still has this medication at home. I recommended starting with 300mg  at night. We will re-evaluate her response and up titrate as needed.  I will see her back after completion of her MRI to discuss the results and further plan of care. She expressed understanding and was in agreement with this plan.  Edsel Goods PA-C Department of neurosurgery

## 2023-10-17 ENCOUNTER — Other Ambulatory Visit: Payer: Self-pay | Admitting: Orthopedic Surgery

## 2023-10-17 DIAGNOSIS — M76891 Other specified enthesopathies of right lower limb, excluding foot: Secondary | ICD-10-CM

## 2023-10-17 DIAGNOSIS — S76011D Strain of muscle, fascia and tendon of right hip, subsequent encounter: Secondary | ICD-10-CM

## 2023-10-18 ENCOUNTER — Encounter: Payer: Self-pay | Admitting: Neurosurgery

## 2023-10-18 ENCOUNTER — Encounter: Payer: Self-pay | Admitting: Orthopedic Surgery

## 2023-10-19 ENCOUNTER — Ambulatory Visit: Admission: RE | Admit: 2023-10-19 | Source: Ambulatory Visit

## 2023-10-24 ENCOUNTER — Ambulatory Visit
Admission: RE | Admit: 2023-10-24 | Discharge: 2023-10-24 | Disposition: A | Discharge status: Home / Self Care | Source: Ambulatory Visit | Attending: Neurosurgery

## 2023-10-24 ENCOUNTER — Ambulatory Visit
Admission: RE | Admit: 2023-10-24 | Discharge: 2023-10-24 | Disposition: A | Discharge status: Home / Self Care | Source: Ambulatory Visit | Attending: Orthopedic Surgery

## 2023-10-24 DIAGNOSIS — S76011D Strain of muscle, fascia and tendon of right hip, subsequent encounter: Secondary | ICD-10-CM

## 2023-10-24 DIAGNOSIS — M76891 Other specified enthesopathies of right lower limb, excluding foot: Secondary | ICD-10-CM

## 2023-10-24 DIAGNOSIS — M5416 Radiculopathy, lumbar region: Secondary | ICD-10-CM

## 2023-11-07 MED ORDER — PREGABALIN 25 MG PO CAPS: 25.0000 mg | ORAL_CAPSULE | Freq: Two times a day (BID) | ORAL | 0 refills | Status: DC

## 2023-11-07 MED ORDER — TRAMADOL HCL 50 MG PO TABS: 50.0000 mg | ORAL_TABLET | Freq: Three times a day (TID) | ORAL | 0 refills | Status: AC | PRN

## 2023-11-10 ENCOUNTER — Telehealth: Admitting: Neurosurgery

## 2023-11-10 ENCOUNTER — Ambulatory Visit: Admitting: Neurosurgery

## 2023-11-10 DIAGNOSIS — M5441 Lumbago with sciatica, right side: Secondary | ICD-10-CM | POA: Diagnosis not present

## 2023-11-10 DIAGNOSIS — M5416 Radiculopathy, lumbar region: Secondary | ICD-10-CM

## 2023-11-10 DIAGNOSIS — G8929 Other chronic pain: Secondary | ICD-10-CM | POA: Diagnosis not present

## 2023-11-10 NOTE — Progress Notes (Signed)
 Neurosurgery Telephone (Audio-Only) Note  Requesting Provider     [facility] [address] [place],  [place] [address] T: [phone] F: [phone]  Primary Care Provider [facility] [address] [place] [address] T: [phone] F: [phone]  Telehealth visit was conducted with Nena Mattock, a 73 y.o. female via telephone.  History of Present Illness: Ms. Kerwood is a 73 y.o presenting via telephone visit to review her MRI results.  Unfortunately despite her recent hip injection, she continues to have her right groin and anterior thigh to the knee.  This is largely unchanged since her last visit although she does have some improvement with starting Lyrica  and taking tramadol  as needed. Today she reiterated that she was doing well until she initiated PT around 6 weeks post-op.The vast majority of her pain is in her right anterior thigh.  10/06/23 Ms. Cazarez presents today for 6 week follow up after lumbar decompression. Unfortunately in the interim she has had an acute onset of right anterior thigh pain and pain in her low back that started after initiating PT and worsened over the worse of a few weeks. She was seen by Dr. Lorelle who recommended a GTB injection and updated MRI of her right hip but also suggested follow up with us  given her anterior thigh pain and hyperesthesias. She reports symptoms similar to her pre-op pain. She is currently taking Meloxicam and Tylenol  which are providing minimal relief.    08/23/23 Nena Mattock is status post right L3-4 decompression. she is doing overall well.    Her severe leg pain is gone.  She still having some discomfort around her right hip.  She is using a rolling walker but is starting with physical therapy next week.  General Review of Systems:  A ROS was performed including pertinent positive and negatives as documented.  All other  systems are negative.     Prior to Admission medications   Medication Sig Start Date End Date Taking? Authorizing Provider  atorvastatin  (LIPITOR) 10 MG tablet Take 10 mg by mouth at bedtime.    [provider]  magnesium  oxide (MAG-OX) 400 (240 Mg) MG tablet Take 400 mg by mouth 2 (two) times daily.    [provider]  methylPREDNISolone  (MEDROL  DOSEPAK) 4 MG TBPK tablet Take as directed on box 10/06/23   Gregory Edsel Ruth, PA  MULTIPLE VITAMIN PO Take 1 tablet by mouth 2 (two) times daily.    [provider]  pregabalin  (LYRICA ) 25 MG capsule Take 1 capsule (25 mg total) by mouth 2 (two) times daily. 11/07/23   Gregory Edsel Ruth, PA  traMADol  (ULTRAM ) 50 MG tablet Take 1 tablet (50 mg total) by mouth 3 (three) times daily as needed for up to 5 days. 11/07/23 11/12/23  Gregory Edsel Ruth, PA    DATA REVIEWED    Imaging Studies  10/24/23 MRI L spine FINDINGS: There is mild rightward curvature of the upper lumbar spine. Mild facet arthrosis and disc desiccation is stable. There is no vertebral body height loss, subluxation or marrow replacing process. The sacrum and SI joints are unremarkable so far as visualized. Conus and cauda equina are unremarkable.   T12-L1: Minimal disc desiccation facet arthrosis. No significant foraminal or spinal stenosis.   L1-2: Minimal disc desiccation facet arthrosis. No significant foraminal or spinal stenosis.   L2-3: Mild broad-based bulge without significant foraminal or spinal stenosis. Mild facet  arthrosis is identified.   L3-4: Mild broad-based bulge and mild-to-moderate facet arthrosis. Probable right laminotomy changes are present, clinical correlation. There is no significant foraminal or spinal stenosis present.   L4-5: Disc desiccation and mild broad-based disc osteophyte. There is slight crowding of the descending nerve roots in lateral recess without impingement. Mild caudal foraminal narrowing is stable  bilaterally. No acute interval change. Mild facet arthrosis is present.   L5-S1: Broad-based disc osteophyte with mild caudal foraminal narrowing bilaterally, right greater than left. No significant foraminal or spinal stenosis is present.   The retroperitoneal structures demonstrate no significant abnormality.   IMPRESSION: Mild degenerative spondylosis and slight dextroscoliosis.   Multilevel disc desiccation and facet arthrosis. There is no high-grade spinal or foraminal stenosis. No significant acute interval change. See above for more detail at each individual level.   Electronically signed by: Norleen Satchel MD 10/26/2023 12:25 PM EDT RP Workstation: [id number]  IMPRESSION  Ms. Rieth is a 73 y.o. female who I performed a telephone encounter today for evaluation and management of ongoing right leg pain  PLAN  I reviewed her MRI with her in detail.  While she does have some ongoing degenerative changes in her lumbar spine, there is no significant stenosis or nerve compression that I can appreciate on her updated MRI. Per the patient her ortho evaluation has not been particularly fruitful and injections have not been helpful. I would like to review her symptoms and imaging with both Dr. Claudene and Dr. Clois to decided on the next course of action.  She did have an EMG nerve conduction study done at [facility] in March prior to her lumbar surgery.  This was very uncomfortable for her and she is not interested in repeating it. We did briefly discuss the possibility of a SCS trial. I will contact her via Mychart with the result of this conversation and next steps  No orders of the defined types were placed in this encounter.    DISPOSITION  Follow up: after discussion with Dr. Claudene and Dr. Clois Edsel Jama Gregory, PA   TELEPHONE DOCUMENTATION  This visit was performed via telephone.  Patient location: home Provider location: office  I spent a total of 16 minutes  non-face-to-face activities for this visit on the date of this encounter including review of current clinical condition and response to treatment.  The patient is aware of and accepts the limits of this telehealth visit.

## 2023-11-11 ENCOUNTER — Telehealth: Payer: Self-pay

## 2023-11-11 NOTE — Telephone Encounter (Signed)
 Authorization has been started for Lyrica  and sent to plan.  PA Case ID #: [id number] Key: [id number]

## 2023-11-14 ENCOUNTER — Encounter: Payer: Self-pay | Admitting: Neurosurgery

## 2023-11-14 NOTE — Telephone Encounter (Signed)
 PA was denied because it was not prescribed for seizures, fibromyalgia, neuropathic pain due to spinal cord injury, cancer pain, diabetic peripheral neuropathy or postherpetic neuralgia. Any other suggestions at this time for the medication? I have a form for an appeal if need to go that route

## 2023-11-14 NOTE — Telephone Encounter (Signed)
 Information faxed over today for appeal

## 2023-11-14 NOTE — Telephone Encounter (Signed)
 Need a note with providers statement typed out in detail stating why we are appealing for patient to take this medication and then I can fax that over to the insurance to review to see if they will change their mind. Postherpetic is for post shingles break out pain.

## 2024-03-26 DIAGNOSIS — G5710 Meralgia paresthetica, unspecified lower limb: Secondary | ICD-10-CM

## 2024-04-13 NOTE — Progress Notes (Signed)
 "   Requesting Provider     [facility] [address] [place],  [place] [address] T: [phone] F: [phone]  Primary Care Provider [facility] [address] [place] [address] T: [phone] F: [phone]   Discussed the use of AI scribe software for clinical note transcription with the patient, who gave verbal consent to proceed.  History of Present Illness Cindy Barber is a 74 year old female with prior right hip replacement and lumbar decompression who presents with progressively worsening right thigh neuropathic pain and lumbar radiculopathy.  Since within 6 weeks of her right hip replacement in summer 2023, she has had burning, shooting, stabbing pain in the right lateral thigh radiating to the knee. This has been a chronic issue for her, she's had lumbar spine surgery since that time. She had a fall in November, and had a resurgence of severe debilitation pain over the past 4 weeks the pain has become continuous, excruciating, and functionally limiting, with frequent sleep disruption. It is worsened by riding in a car, lifting the leg into bed, and hip flexion, and is partially relieved by lying back with the leg extended. The area is extremely tender to touch, especially by others. She has associated numbness, paresthesias, and weakness with knee extension, without symptoms in the foot or left leg and without obvious right thigh atrophy.  She has received multiple hip, sacroiliac joint, and epidural steroid injections with only transient benefit and no meaningful improvement in thigh pain. She remains in extensive physical therapy, where meralgia paresthetica was considered.  An EMG at [facility] about 1.5 years ago showed L4 or L5 nerve involvement. Lumbar MRI in July 2025 did not show significant disc herniation or acute abnormality. She fell on February 14, 2024 without immediate  worsening, but her pain has escalated over the last 4 weeks.  She previously tried Lyrica  and Cymbalta but could not tolerate Cymbalta. She has not used recent oral steroids aside from a Medrol  Dosepak within the last 2 years. Tylenol  is no longer effective.   Telehealth visit was conducted with Nena Mattock, a 74 y.o. female via telephone.  General Review of Systems:  A ROS was performed including pertinent positive and negatives as documented.  All other systems are negative.     Prior to Admission medications   Medication Sig Start Date End Date Taking? Authorizing Provider  atorvastatin  (LIPITOR) 10 MG tablet Take 10 mg by mouth at bedtime.    [provider]  magnesium  oxide (MAG-OX) 400 (240 Mg) MG tablet Take 400 mg by mouth 2 (two) times daily.    [provider]  methylPREDNISolone  (MEDROL  DOSEPAK) 4 MG TBPK tablet Take as directed on box 10/06/23   Gregory Edsel Ruth, PA  MULTIPLE VITAMIN PO Take 1 tablet by mouth 2 (two) times daily.    [provider]  pregabalin  (LYRICA ) 25 MG capsule Take 1 capsule (25 mg total) by mouth 2 (two) times daily. 11/07/23   Gregory Edsel Ruth, PA  traMADol  (ULTRAM ) 50 MG tablet Take 1 tablet (50 mg total) by mouth 3 (three) times daily as needed for up to 5 days. 11/07/23 11/12/23  Gregory Edsel Ruth, PA    DATA REVIEWED    Imaging Studies  10/24/23 MRI L spine FINDINGS: There is mild rightward curvature of the upper lumbar spine. Mild facet arthrosis and disc desiccation is stable. There is no vertebral body height  loss, subluxation or marrow replacing process. The sacrum and SI joints are unremarkable so far as visualized. Conus and cauda equina are unremarkable.   T12-L1: Minimal disc desiccation facet arthrosis. No significant foraminal or spinal stenosis.   L1-2: Minimal disc desiccation facet arthrosis. No significant foraminal or spinal stenosis.   L2-3: Mild broad-based bulge without significant foraminal or  spinal stenosis. Mild facet arthrosis is identified.   L3-4: Mild broad-based bulge and mild-to-moderate facet arthrosis. Probable right laminotomy changes are present, clinical correlation. There is no significant foraminal or spinal stenosis present.   L4-5: Disc desiccation and mild broad-based disc osteophyte. There is slight crowding of the descending nerve roots in lateral recess without impingement. Mild caudal foraminal narrowing is stable bilaterally. No acute interval change. Mild facet arthrosis is present.   L5-S1: Broad-based disc osteophyte with mild caudal foraminal narrowing bilaterally, right greater than left. No significant foraminal or spinal stenosis is present.   The retroperitoneal structures demonstrate no significant abnormality.   IMPRESSION: Mild degenerative spondylosis and slight dextroscoliosis.   Multilevel disc desiccation and facet arthrosis. There is no high-grade spinal or foraminal stenosis. No significant acute interval change. See above for more detail at each individual level.   Electronically signed by: Norleen Satchel MD 10/26/2023 12:25 PM EDT RP Workstation: [id number]   Neurologic examination shows severe allodynia/decrease sensation in the right anterior and lateral thigh including the knee.  She has decreased patellar reflex when compared to contralateral side.  Does not show significant muscle wasting.  She does have some weakness with knee extension and hip flexion although this does seem to be quite pain limited.  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 The pain is refractory to prior interventions including hip replacement, lumbar decompression,  sacroiliac and hip injections, and multiple neuropathic medications. The distribution implicates femoral and lateral femoral cutaneous nerves, with possible saphenous nerve involvement.  This has been a constant issue for her, however she had a fall in November and over the past over the past 4 weeks, pain has become debilitating covering more of her thigh and knee with minimal relief from current medications and physical therapy. Multifocal nerve involvement precludes surgical decompression, this does not appear to be clearly related to LF CN distribution. Spinal cord stimulation evaluation is recommended as the most promising intervention, given its efficacy for chronic, focal neuropathic pain syndromes and ability to address pain throughout the thigh. Risks include procedural complications and potential for inadequate pain relief; however, outcomes are favorable if the trial yields =50% improvement.  - Advised maintaining a steady, prescribed dose of gabapentin and avoiding non-prescribed dose escalation  - Recommended discussion with primary care provider regarding trial of pregabalin  (Lyrica ) as an alternative neuropathic agent. - Referred for spinal cord stimulator trial, including required psychological evaluation and coordination with pain management specialists. - Provided information for scheduling psychological evaluation (virtual/phone visit). - Discussed that, if spinal cord stimulator trial demonstrates >50% improvement - Will follow up with other providers [place], [place]) to coordinate care.  Lumbar radiculopathy She has lumbar radiculopathy, previously confirmed by EMG and treated with L4/L5 decompression and epidural steroid injections. Current symptoms may have a radicular component, however this would be at a more cranial segment than previously operated on.  Given acute worsening of pain  progressive weakness and debilitating symptomatology, further imaging is warranted to exclude  new or progressive pathology. - Ordered lumbar spine MRI to evaluate for new or progressive disc herniation or other causes of radiculopathy. - Ordered Medrol  Dosepak for short-term symptomatic relief.  Penne LELON Sharps MD  "

## 2024-04-18 NOTE — Progress Notes (Signed)
 [facility] 04/18/2024 Note Type: Progress Note Progress Reporting Period: 03/12/2024 - 04/18/2024 Patient Name: Cindy Barber Date of Birth:1950/10/16 Diagnosis:  Encounter Diagnoses  Name Primary?   Unspecified abnormalities of gait and mobility Yes   Midline low back pain, unspecified chronicity, unspecified whether sciatica present    Weakness    Referring MD:  Richard Ozell Pac, MD  Date of Onset of Impairment-No date available Date PT Care Plan Established or Reviewed-03/12/2024 Date PT Treatment Started-03/12/2024  Visit Count: 10 Plan of Care Effective Date: 03/12/2024 - 06/10/2024     Assessment/Plan:   Assessment Assessment details:    Progress Note Assessment: Patient is presenting for her 10th PT session for the treatment of low back pain and radiating R LE pain and increased risk of falls. Patient has been making excellent progress in therapy and has been compliant with HEP. Patient has currently met 2 STG's and 2 LTG's and is making progress toward other PT goals. Patient has made significant improvements in static balance, lumbar ROM, hip/extensor chain strength, and walking tolerance compared to evaluation. Patient continues to present with decreased/painful lumbar mobility, core weakness, hip/extensor chain weakness, decreased stability with gait, decreased stability with narrow BOS, decreased stability with visual occlusion, and decreased SL stability. Patient would benefit from continued skilled PT to address the above impairments and promote achievement of PT goals.   Evaluation Assessment: Patient is a 74 y.o. female presenting to outpatient physical therapy with a chief complaint of low back pain and decreased balance. Patient is presenting with signs and symptoms consistent with chronic low back pain with radiating pain and increased risk of falls. Primary impairments include decreased/painful lumbar  mobility, core weakness, hip/extensor chain weakness, decreased stability with gait, decreased stability with narrow BOS, decreased stability with visual occlusion, and decreased SL stability. The patient also is presenting with significant fear of falling which is contributing to the patients presentation. These impairments are leading to activity limitations of difficulty standing, walking, bending, and lifting which is leading to participation restrictions of difficulty navigating the home/community, performing ADL's and working outside. Patient would benefit from skilled PT with an emphasis on core strength, lumbar ROM, hip/extensor chain strength, and static an dynamic balance training.  Treatment Assessment: Tolerated the session without adverse effects. PN completed. Patient would benefit from continued PT.    Impairments: decreased mobility, decreased strength, decreased endurance, decreased range of motion, gait deviation, impaired balance, impaired ADLs, poor awareness of body mechanics, core weakness and pain       Specific Comorbidities: Osteopenia, anxiety/depression      Therapy Goals     Goals:     Short Term Goals 8 visits 1. Pt will improve ODI from 30/50 to at least 25/50 to demonstrate reduced lower extremity disability needed to improve functional mobility and performance of ADL's. MET 2. Pt will verbalize understanding of HEP prescription and be independent with performance of listed interventions. MET  Long Term Goals 16 visits 1. Pt will improve ODI from 30/50 to at least 20/50 to demonstrate reduced lower extremity disability needed to improve functional mobility and performance of ADL's. ONGOING 2. Pt will be able to ambulate for 500 feet using no device without instability or increase in back pain to demonstrate improved LE endurance required for community ambulation. ONGOING 3. Pt will perform floor transfer indep with use of UEs as needed to demonstrate improved  functional balance and demonstrate improved LE strength. ONGOING 4. Pt  will improve SL stance to at least 6 seconds bilateral to improve ability to navigate steps and curbs. ONGOING 5. Pt will be independent with HEP performance and progression. ONGOING 6. Pt will improve stance with FT and EC to at least 30 seconds to demonstrate improved ability to walk at night and demonstrate reduced fear of falling. MET 7. Pt will improve 30 second sit to stand by at least 5 repetitions to demonstrate improved LE strength. MET  Plan   Therapy options: will be seen for skilled physical therapy services   Planned therapy interventions: 97140-Manual Therapy, 97164-Re-evaluation (PT), 97530-Therapeutic Activities, 97010-Cold Packs/Hot Packs, 97110-Therapeutic Exercises, 97112-Neuromuscular Re-education, 97116-Gait Training and 97750-Physical Performance Test    Frequency: 1-2 times per week.   Duration in weeks: 16 visits   Education provided to: patient.   Education provided: HEP   Education results: verbalized good understanding.   Communication/Consultation: N/A.   Next visit plan:       Continue with current interventions according to the current POC. Emphasis on core, hip, and extensor chain strengthening and mobility exercises as appropriate and tolerated, manual to address soft tissue restrictions and joint mobility deficits, modalities as indicated for pain. Static an dynamic balance training.   Total Treatment Time: 42   Total Timed Code Minutes: 42   Treatment rendered today:     See below   Plan details: Patient is in agreement with the current POC.  Subjective:   History of Present Condition    History of Present Condition/Chief Complaint:  LBP and decreased balance Subjective:  Patient reported that she felt good after last session. She has an appointment with her neurologist Monday. Pain:    Current pain rating:  5  Location:  Low back and right leg Precautions/Equipment  Precautions:   Osteopenia and Fall risk   Equipment Currently Used:  Rolling walker Patient Goals:    Patient/Family goals for therapy:  Decreased pain, improved ambulation, improved sitting tolerance, improved standing tolerance, increased ROM, increased strength, independence with ADLs/IADLs, return to recreational activites, improved balance and decrease assistance with ADLs   Other patient goals:  Decrease pain, improve strength, be able to perform floor transfer, be able to reach in cabinets, walk without a device.  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 30 2. 30 seconds 3. 30 seconds, unable to attain position without UEs 4. < 1 seconds bilateral and pain with lifting the right LE   mCTSIB: 1. 30 seconds 2. 30 seconds 3. 30 seconds 4. 9 seconds  Gait: 170 feet no device  Outcome Measures and Additional Tests: ODI:   Session number/total number of treatment sessions:  10/16 PR note due by visit # 16.  Return appointment with referring provider: TBD  Treatment Rendered:    Date 04/06/2024 04/09/2024 04/16/2024 04/18/2024   Therapist  Camellia Plana, PT Camellia Plana, PT Camellia Plana, PT Camellia Plana, PT  Seated bike      Bridge 2 X 15 2 X 20 2 X 20 2 X 20  LTR X 2 min X 2 min X 2 min   SKTC      BKFO X 10     STS 2 X 10 low surface 8# 2 X 12 low surface 8# 2 X 12 low surface 8#   Supine marching       Seated marching    X 10 B alt 2 X 10 B alt 2.5# each  Standing marching      Standing leg curls X 10 R   2 X 10 R  Supine ball squeeze      Standing hip abduction      Supine clamshell 2 X 20 BTB 2 X 20 BTB 2 X 20 BTB 2 X 20 BTB  Clamshell 2 X 15 R YTB 2 X 15-20 R YTB 2 X 15-20 R GTB   Standing hip abduction    2 X 15 B  SLR      Iso ball squeeze with GH extension with strap      Leg press   X 15 25# 3 X 15 40#   Balance:      Standing marching no UE  2 X 15 B no UE dark room    Step ups  X 23 up one UE leading with L LE X 10 up R one UE X 23 up one UE reciprocal X 23 down on UE step to pattern   Step taps  X 10 B no UE X 10 B no UE   Standing Wide BOS dark room      Stance FT with head turns      Standing weight shifts FT  X 20 ant post    Tandem stance      Semi tandem 2 X 30 with head turns B     Staggered stance weight shifting X 10 B ant/post     Side stepping      Stance FT EC      Stance on foam       Staggered stance one foot on half foam      Reactive balance training X 10 lateral B requiring stepping response X 5 B posterior requiring stepping response    Sticky note taps 2 X 9 lateral B 2 X 10 star pattern B X 12 B multiple dirctions 4 X 12 multiple directions   Backwards walking      Ambulation X 130 feet no device CGA X 200 feet no device CGA open area X 300 feet no device CGA open area X 160 feet no device        * Exercises added to home exercise program  Physical Performance Test x 15 minutes. Performed re evaluation of functional outcome measures including 30 second STS, ROM, mCTSIB, and 4 stage Balance test.  Therapeutic Exercises x 27 minutes. Patient was instructed in exercises to improve lumbar ROM, core strength, and hip/extensor chain strength to improve functional mobility, decrease risk of falls, and reduce back/LE pain.  PT Evaluation Charges $$ 97750 - Physical Performance Test/Measurement [mins]: 15   Therapeutic Interventions Charges $$  97110 - Therapeutic Exercise [mins]: 27        I attest that I have reviewed the above information. Signed: Camellia JONELLE Plana, PT 04/18/2024 3:12 PM

## 2024-04-23 ENCOUNTER — Ambulatory Visit: Admitting: Neurosurgery

## 2024-04-23 ENCOUNTER — Encounter: Payer: Self-pay | Admitting: Neurosurgery

## 2024-04-23 VITALS — BP 110/70 | Wt 130.6 lb

## 2024-04-23 DIAGNOSIS — G5711 Meralgia paresthetica, right lower limb: Secondary | ICD-10-CM

## 2024-04-23 DIAGNOSIS — M545 Low back pain, unspecified: Secondary | ICD-10-CM | POA: Diagnosis not present

## 2024-04-23 DIAGNOSIS — M5416 Radiculopathy, lumbar region: Secondary | ICD-10-CM

## 2024-04-23 DIAGNOSIS — G5710 Meralgia paresthetica, unspecified lower limb: Secondary | ICD-10-CM

## 2024-04-23 MED ORDER — METHYLPREDNISOLONE 4 MG PO TBPK: ORAL_TABLET | ORAL | 0 refills | Status: AC

## 2024-04-23 NOTE — Patient Instructions (Signed)

## 2024-04-24 ENCOUNTER — Other Ambulatory Visit: Payer: Self-pay | Admitting: Neurosurgery

## 2024-04-24 DIAGNOSIS — G8929 Other chronic pain: Secondary | ICD-10-CM

## 2024-04-24 DIAGNOSIS — M5416 Radiculopathy, lumbar region: Secondary | ICD-10-CM

## 2024-04-24 DIAGNOSIS — G894 Chronic pain syndrome: Secondary | ICD-10-CM

## 2024-04-24 NOTE — Progress Notes (Signed)
 Discussed with Dr. Clois, will plan for [facility] referral.

## 2024-05-15 ENCOUNTER — Ambulatory Visit (HOSPITAL_BASED_OUTPATIENT_CLINIC_OR_DEPARTMENT_OTHER)

## 2024-05-24 ENCOUNTER — Ambulatory Visit: Admitting: Student in an Organized Health Care Education/Training Program
